# Patient Record
Sex: Female | Born: 1995
Health system: Southern US, Community
[De-identification: ages and names within clinical notes are randomized; demographics above are authoritative.]

## PROBLEM LIST (undated history)

## (undated) DIAGNOSIS — R519 Headache, unspecified: Secondary | ICD-10-CM

## (undated) DIAGNOSIS — I10 Essential (primary) hypertension: Secondary | ICD-10-CM

## (undated) HISTORY — PX: NO PAST SURGERIES: SHX2092

## (undated) HISTORY — DX: Essential (primary) hypertension: I10

---

## 2015-10-07 ENCOUNTER — Emergency Department (HOSPITAL_COMMUNITY): Payer: No Typology Code available for payment source

## 2015-10-07 ENCOUNTER — Emergency Department (HOSPITAL_COMMUNITY)
Admission: EM | Admit: 2015-10-07 | Discharge: 2015-10-07 | Disposition: A | Discharge status: Home / Self Care | Payer: No Typology Code available for payment source | Attending: Emergency Medicine | Admitting: Emergency Medicine

## 2015-10-07 ENCOUNTER — Encounter (HOSPITAL_COMMUNITY): Payer: Self-pay | Admitting: Emergency Medicine

## 2015-10-07 DIAGNOSIS — Y998 Other external cause status: Secondary | ICD-10-CM | POA: Insufficient documentation

## 2015-10-07 DIAGNOSIS — Y9389 Activity, other specified: Secondary | ICD-10-CM | POA: Insufficient documentation

## 2015-10-07 DIAGNOSIS — Y92410 Unspecified street and highway as the place of occurrence of the external cause: Secondary | ICD-10-CM | POA: Diagnosis not present

## 2015-10-07 DIAGNOSIS — Z3202 Encounter for pregnancy test, result negative: Secondary | ICD-10-CM | POA: Diagnosis not present

## 2015-10-07 DIAGNOSIS — S3992XA Unspecified injury of lower back, initial encounter: Secondary | ICD-10-CM | POA: Insufficient documentation

## 2015-10-07 DIAGNOSIS — M545 Low back pain, unspecified: Secondary | ICD-10-CM

## 2015-10-07 LAB — POC URINE PREG, ED: PREG TEST UR: NEGATIVE

## 2015-10-07 MED ORDER — IBUPROFEN 800 MG PO TABS
800.0000 mg | ORAL_TABLET | Freq: Once | ORAL | Status: AC
  Administered 2015-10-07: 800 mg via ORAL
  Filled 2015-10-07: qty 1

## 2015-10-07 NOTE — Discharge Instructions (Signed)
Continue taking ibuprofen as prescribed over-the-counter for pain relief. You may also continue using a heating pad for pain relief. Follow-up with a primary care provider listed in the resource guide provided below. Return to the emergency department if symptoms worsen or new onset of fever, numbness, tingling, loss of bowel or bladder, weakness.    Emergency Department Resource Guide 1) Find a Doctor and Pay Out of Pocket Although you won't have to find out who is covered by your insurance plan, it is a good idea to ask around and get recommendations. You will then need to call the office and see if the doctor you have chosen will accept you as a new patient and what types of options they offer for patients who are self-pay. Some doctors offer discounts or will set up payment plans for their patients who do not have insurance, but you will need to ask so you aren't surprised when you get to your appointment.  2) Contact Your Local Health Department Not all health departments have doctors that can see patients for sick visits, but many do, so it is worth a call to see if yours does. If you don't know where your local health department is, you can check in your phone book. The CDC also has a tool to help you locate your state's health department, and many state websites also have listings of all of their local health departments.  3) Find a Walk-in Clinic If your illness is not likely to be very severe or complicated, you may want to try a walk in clinic. These are popping up all over the country in pharmacies, drugstores, and shopping centers. They're usually staffed by nurse practitioners or physician assistants that have been trained to treat common illnesses and complaints. They're usually fairly quick and inexpensive. However, if you have serious medical issues or chronic medical problems, these are probably not your best option.  No Primary Care Doctor: - Call Health Connect at  [phone] - they  can help you locate a primary care doctor that  accepts your insurance, provides certain services, etc. - Physician Referral Service- [phone]  Chronic Pain Problems: Organization         Address  Phone   Notes  [facility]  [phone] Patients need to be referred by their primary care doctor.   Medication Assistance: Organization         Address  Phone   Notes  [facility] [address] [place], [place] [address] [phone] --Must be a resident of [facility] [place] -- Must have NO insurance coverage whatsoever (no Medicaid/ Medicare, etc.) -- The pt. MUST have a primary care doctor that directs their care regularly and follows them in the community   MedAssist  [phone]   [employer]  [phone]    Agencies that provide inexpensive medical care: Organization         Address  Phone   Notes  [facility]  [phone]   [facility]    [phone]   [facility] [address] [place], [place] [address] [phone]   [facility] [address], [place] [phone]   [facility]anned Parenthood    [phone]) [phone]   [facility]    [phone]   [facility]  [address], [place] Phone:  [phone], Fax:  [phone]  Hours of Operation:  9 am - 6 pm, M-F.  Also accepts Medicaid/Medicare and self-pay.  [facility] for [place], [address], [place] Phone: [phone], Fax: [phone]. Hours of Operation:  8:30 am - 5:30 pm, M-F.  Also accepts Medicaid and self-pay.  [facility] [address] [place] Phone: [phone]   [place], [place], [place] [phone], Ext. [phone] Mondays & Thursdays: 7-9 AM.  First 15 patients are seen on a first come, first serve basis.    [place] Providers:  Organization         Address  Phone   Notes  [facility] [address], [place] [phone] Also accepts self-pay patients.  [facility] [id number] [place], [place]  [phone]   [place], [address], [place] [phone]   [facility] [address] [place] [phone]   Lucianne Lei [address], [place]   [phone] Only accepts [place] Access [place] patients after they have their name applied to their card.   Self-Pay (no insurance) in [facility]:  Organization         Address  Phone   Notes  Sickle Cell Patients, [facility] Internal Medicine [place] [phone]   [facility] [place] [phone]   [facility] [place]  [place], [place], [place] [phone]   [facility]/Dr. Osei-Bonsu  [address] [place] or [address], [place] [phone] Phone number for both [place] and [employer] locations is the same.  Urgent Medical and [facility] [address], [place] [phone]   [facility] [address], [place] or [address] [phone]   [facility] [address], [place] [phone], phone; [phone], fax Sees patients 1st and 3rd Saturday of every month.  Must not qualify for public or private insurance (i.e. Medicaid, Medicare, [place] Health Choice, Veterans' Benefits)  Household income should be no more than 200% of the poverty level The clinic cannot treat you if you are pregnant or think you are pregnant  Sexually transmitted diseases are not treated at the clinic.    Dental Care: Organization         Address  Phone  Notes  [facility] of [facility] [place] [phone] Accepts children up to age 43 who are enrolled in [place] or [place]; pregnant women with a Medicaid card; and children who have applied for Medicaid or [place] Health Choice, but were declined, whose parents can pay a reduced fee at time of service.  [facility] of [facility]  [address], [place] [phone] Accepts children up to age 45 who are enrolled in [place] or [place]; pregnant women with a Medicaid card; and children who have applied for Medicaid or [place] Health Choice, but were declined, whose parents can pay a reduced fee at time of service.  [place] Adult Dental Access PROGRAM  [place] [phone] Patients are seen by appointment only. Walk-ins are not accepted. [place] will  see patients 42 years of age and older. Monday - Tuesday (8am-5pm) Most Wednesdays (8:30-5pm) $30 per visit, cash only  [facility] Adult Dental Access PROGRAM  [address], [facility] [phone] Patients are seen by appointment only. Walk-ins are not accepted. [place] will see patients 35 years of age and older. One Wednesday Evening (Monthly: Volunteer Based).  $30 per visit, cash only  [place]  [phone] for adults; Children under age 10, call Graduate Pediatric Dentistry at [phone]. Children aged 32-14, please call [phone] to request a pediatric application.  Dental services are provided in all areas of dental care including fillings, crowns and bridges, complete and partial dentures, implants, gum treatment, root canals, and extractions. Preventive care is also provided. Treatment is provided to both adults and children. Patients are selected via a lottery and there is often a waiting list.   [facility] [address], [place]  [phone] [email or web address]   [facility]  [facility] [address] [place], [place] [phone], Ext. [phone] Second and Fourth Thursday of each month, opens at 6:30 AM; Clinic ends at 9 AM.  Patients are seen on a first-come first-served basis, and a limited number are seen during each clinic.   [facility]  [address]ard Danker [place], [place] [phone]   Eligibility Requirements You must have lived in [place], [place], or [place] counties for at least the last three months.   You cannot be eligible for state or federal sponsored [employer], including [employer], [place], or [employer].   You generally cannot be eligible for healthcare insurance through your employer.    How to apply: Eligibility screenings are held every Tuesday and Wednesday afternoon from 1:00 pm until 4:00 pm. You do not need an appointment for the interview!  [facility] [address], [place], [place]   [place]  [place] Department  [place]  [phone]    Behavioral Health Resources in the Community: Intensive Outpatient Programs Organization         Address  Phone  Notes  [place] [facility]. [address], [place], [place] [phone]   [address] [facility] Outpatient [address], [place], [place]   ADS: [facility] [address] [place], [place]   [place] [address],  [place], [place] or [phone]   Substance Abuse Resources Organization         Address  Phone  Notes  Alcohol and Drug Services  [phone]   [place]  [phone]   [facility]   Chinita Pester  [phone]   Residential & Outpatient Substance Abuse Program  [phone]   Psychological Services Organization         Address  Phone  Notes  [facility]   [place]  [phone]   [place] [address], [place] or [phone]    Mobile Crisis Teams Organization         Address  Phone  Notes  [facility], Mobile Crisis Care Unit  [phone]   [facility]otherapeutic Services  [address] [place], [place]   [facility] [address] [place] [phone]    Self-Help/Support Groups Organization  Address  Phone             Notes  [facility] [place] - variety of support groups  [phone]- I7437963 Call for more information  Narcotics Anonymous (NA), [facility] [address], [employer] [place]  2 meetings at this location   Statistician         Address  Phone  Notes  [facility] [address],    [place]  [phone]   [facility]  [address], [place] [address], [place], [place] [phone]   [facility] [address] [place], [place] [phone] Admissions: 8am-3pm M-F  [facility] [address]    [place], [place] [phone]   [facility] [address] [place], [place], [place] [phone]   [facility] [address]  [place], [place] [phone]   [facility] - Intensive Outpatient [address], [place], [place] [phone]   [facility] (Addiction Recovery Care [facility]oc[facility] [address] [place].,  [place], [place] [phone] or [phone]   [facility] (RTS) [address], [place], [place] [phone] Accepts Medicaid  [facility] [address],  [place] [phone] Substance Abuse/Addiction Treatment   [facility] Organization         Address  Phone  Notes  [facility]  [phone]   Angie Fava, PhD [address] Ervin Knack [place], [place]   [phone] or  [phone]   [facility]   [address] [place], [place] [phone]   [facility] [address], [place], [place] [phone] Insurance/Medicaid/sponsorship through [facility] and Families [address]                                    [place], [place] [phone] Therapy/tele-psych/case  [facility] [address], [place] [phone]    Dr. Lolly Mustache  [phone]   [facility]  United Way [facility] Dept. 1) [address], [place] 2) [address] [place] 3)  [address] [place] [address], [place] [phone]  [phone]   [facility] Child Abuse Hotline 872 552 7053 or [phone] (After Hours)
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
## 2019-09-08 DIAGNOSIS — S5001XA Contusion of right elbow, initial encounter: Secondary | ICD-10-CM | POA: Diagnosis not present

## 2019-09-17 DIAGNOSIS — Z1321 Encounter for screening for nutritional disorder: Secondary | ICD-10-CM | POA: Diagnosis not present

## 2019-09-17 DIAGNOSIS — M545 Low back pain: Secondary | ICD-10-CM | POA: Diagnosis not present

## 2019-09-17 DIAGNOSIS — E8881 Metabolic syndrome: Secondary | ICD-10-CM | POA: Diagnosis not present

## 2019-09-17 DIAGNOSIS — R6889 Other general symptoms and signs: Secondary | ICD-10-CM | POA: Diagnosis not present

## 2019-09-17 DIAGNOSIS — R5382 Chronic fatigue, unspecified: Secondary | ICD-10-CM | POA: Diagnosis not present

## 2019-10-15 DIAGNOSIS — R632 Polyphagia: Secondary | ICD-10-CM | POA: Diagnosis not present

## 2019-10-15 DIAGNOSIS — Z7189 Other specified counseling: Secondary | ICD-10-CM | POA: Diagnosis not present

## 2019-10-15 DIAGNOSIS — Z713 Dietary counseling and surveillance: Secondary | ICD-10-CM | POA: Diagnosis not present

## 2019-12-05 DIAGNOSIS — Z3009 Encounter for other general counseling and advice on contraception: Secondary | ICD-10-CM | POA: Diagnosis not present

## 2019-12-05 DIAGNOSIS — Z3200 Encounter for pregnancy test, result unknown: Secondary | ICD-10-CM | POA: Diagnosis not present

## 2019-12-16 DIAGNOSIS — Z03818 Encounter for observation for suspected exposure to other biological agents ruled out: Secondary | ICD-10-CM | POA: Diagnosis not present

## 2019-12-27 DIAGNOSIS — Z3A00 Weeks of gestation of pregnancy not specified: Secondary | ICD-10-CM | POA: Diagnosis not present

## 2019-12-27 DIAGNOSIS — Z3490 Encounter for supervision of normal pregnancy, unspecified, unspecified trimester: Secondary | ICD-10-CM | POA: Diagnosis not present

## 2019-12-27 DIAGNOSIS — O210 Mild hyperemesis gravidarum: Secondary | ICD-10-CM | POA: Diagnosis not present

## 2019-12-27 DIAGNOSIS — R112 Nausea with vomiting, unspecified: Secondary | ICD-10-CM | POA: Diagnosis not present

## 2019-12-29 ENCOUNTER — Other Ambulatory Visit: Payer: Self-pay

## 2019-12-29 ENCOUNTER — Encounter (HOSPITAL_COMMUNITY): Payer: Self-pay | Admitting: Obstetrics and Gynecology

## 2019-12-29 ENCOUNTER — Inpatient Hospital Stay (HOSPITAL_COMMUNITY): Payer: BC Managed Care – PPO

## 2019-12-29 ENCOUNTER — Inpatient Hospital Stay (HOSPITAL_COMMUNITY)
Admission: AD | Admit: 2019-12-29 | Discharge: 2019-12-29 | Disposition: A | Discharge status: Home / Self Care | Payer: BC Managed Care – PPO | Attending: Obstetrics and Gynecology | Admitting: Obstetrics and Gynecology

## 2019-12-29 DIAGNOSIS — O210 Mild hyperemesis gravidarum: Secondary | ICD-10-CM | POA: Diagnosis not present

## 2019-12-29 DIAGNOSIS — O4691 Antepartum hemorrhage, unspecified, first trimester: Secondary | ICD-10-CM

## 2019-12-29 DIAGNOSIS — Z3491 Encounter for supervision of normal pregnancy, unspecified, first trimester: Secondary | ICD-10-CM

## 2019-12-29 DIAGNOSIS — Z3A12 12 weeks gestation of pregnancy: Secondary | ICD-10-CM | POA: Insufficient documentation

## 2019-12-29 DIAGNOSIS — Z3A08 8 weeks gestation of pregnancy: Secondary | ICD-10-CM

## 2019-12-29 DIAGNOSIS — Z6790 Unspecified blood type, Rh positive: Secondary | ICD-10-CM | POA: Insufficient documentation

## 2019-12-29 DIAGNOSIS — O4690 Antepartum hemorrhage, unspecified, unspecified trimester: Secondary | ICD-10-CM

## 2019-12-29 DIAGNOSIS — O208 Other hemorrhage in early pregnancy: Secondary | ICD-10-CM | POA: Diagnosis not present

## 2019-12-29 DIAGNOSIS — O468X1 Other antepartum hemorrhage, first trimester: Secondary | ICD-10-CM

## 2019-12-29 DIAGNOSIS — O418X10 Other specified disorders of amniotic fluid and membranes, first trimester, not applicable or unspecified: Secondary | ICD-10-CM | POA: Diagnosis not present

## 2019-12-29 HISTORY — DX: Headache, unspecified: R51.9

## 2019-12-29 LAB — COMPREHENSIVE METABOLIC PANEL
ALT: 15 U/L (ref 0–44)
AST: 18 U/L (ref 15–41)
Albumin: 3.8 g/dL (ref 3.5–5.0)
Alkaline Phosphatase: 53 U/L (ref 38–126)
Anion gap: 10 (ref 5–15)
BUN: 7 mg/dL (ref 6–20)
CO2: 23 mmol/L (ref 22–32)
Calcium: 9.3 mg/dL (ref 8.9–10.3)
Chloride: 106 mmol/L (ref 98–111)
Creatinine, Ser: 0.64 mg/dL (ref 0.44–1.00)
GFR calc Af Amer: >60 mL/min (ref >60)
GFR calc non Af Amer: >60 mL/min (ref >60)
Glucose, Bld: 109 mg/dL — ABNORMAL HIGH (ref 70–99)
Potassium: 3.4 mmol/L — ABNORMAL LOW (ref 3.5–5.1)
Sodium: 139 mmol/L (ref 135–145)
Total Bilirubin: 0.3 mg/dL (ref 0.3–1.2)
Total Protein: 7.2 g/dL (ref 6.5–8.1)

## 2019-12-29 LAB — WET PREP, GENITAL
Sperm: NONE SEEN
Trich, Wet Prep: NONE SEEN
Yeast Wet Prep HPF POC: NONE SEEN

## 2019-12-29 LAB — URINALYSIS, ROUTINE W REFLEX MICROSCOPIC
Bilirubin Urine: NEGATIVE
Glucose, UA: NEGATIVE mg/dL
Ketones, ur: NEGATIVE mg/dL
Leukocytes,Ua: NEGATIVE
Nitrite: NEGATIVE
Protein, ur: 30 mg/dL — AB
Specific Gravity, Urine: 1.029 (ref 1.005–1.030)
pH: 5 (ref 5.0–8.0)

## 2019-12-29 LAB — CBC
HCT: 37.6 % (ref 36.0–46.0)
Hemoglobin: 12.7 g/dL (ref 12.0–15.0)
MCH: 29 pg (ref 26.0–34.0)
MCHC: 33.8 g/dL (ref 30.0–36.0)
MCV: 85.8 fL (ref 80.0–100.0)
Platelets: 362 10*3/uL (ref 150–400)
RBC: 4.38 MIL/uL (ref 3.87–5.11)
RDW: 12.7 % (ref 11.5–15.5)
WBC: 13.6 10*3/uL — ABNORMAL HIGH (ref 4.0–10.5)
nRBC: 0 % (ref 0.0–0.2)

## 2019-12-29 LAB — HCG, QUANTITATIVE, PREGNANCY: hCG, Beta Chain, Quant, S: 141320 m[IU]/mL — ABNORMAL HIGH (ref <5)

## 2019-12-29 LAB — ABO/RH: ABO/RH(D): O POS

## 2019-12-29 NOTE — Discharge Instructions (Signed)
Morning Sickness  Morning sickness is when you feel sick to your stomach (nauseous) during pregnancy. You may feel sick to your stomach and throw up (vomit). You may feel sick in the morning, but you can feel this way at any time of day. Some women feel very sick to their stomach and cannot stop throwing up (hyperemesis gravidarum). Follow these instructions at home: Medicines  Take over-the-counter and prescription medicines only as told by your doctor. Do not take any medicines until you talk with your doctor about them first.  Taking multivitamins before getting pregnant can stop or lessen the harshness of morning sickness. Eating and drinking  Eat dry toast or crackers before getting out of bed.  Eat 5 or 6 small meals a day.  Eat dry and bland foods like rice and baked potatoes.  Do not eat greasy, fatty, or spicy foods.  Have someone cook for you if the smell of food causes you to feel sick or throw up.  If you feel sick to your stomach after taking prenatal vitamins, take them at night or with a snack.  Eat protein when you need a snack. Nuts, yogurt, and cheese are good choices.  Drink fluids throughout the day.  Try ginger ale made with real ginger, ginger tea made from fresh grated ginger, or ginger candies. General instructions  Do not use any products that have nicotine or tobacco in them, such as cigarettes and e-cigarettes. If you need help quitting, ask your doctor.  Use an air purifier to keep the air in your house free of smells.  Get lots of fresh air.  Try to avoid smells that make you feel sick.  Try: ? Wearing a bracelet that is used for seasickness (acupressure wristband). ? Going to a doctor who puts thin needles into certain body points (acupuncture) to improve how you feel. Contact a doctor if:  You need medicine to feel better.  You feel dizzy or light-headed.  You are losing weight. Get help right away if:  You feel very sick to your  stomach and cannot stop throwing up.  You pass out (faint).  You have very bad pain in your belly. Summary  Morning sickness is when you feel sick to your stomach (nauseous) during pregnancy.  You may feel sick in the morning, but you can feel this way at any time of day.  Making some changes to what you eat may help your symptoms go away. This information is not intended to replace advice given to you by your health care provider. Make sure you discuss any questions you have with your health care provider. Document Revised: 11/16/2017 Document Reviewed: 01/04/2017 Elsevier Patient Education  2020 Elsevier Inc. Vaginal Bleeding During Pregnancy, First Trimester  A small amount of bleeding (spotting) from the vagina is common during early pregnancy. Sometimes the bleeding is normal and does not cause problems. At other times, though, bleeding may be a sign of something serious. Tell your doctor about any bleeding from your vagina right away. Follow these instructions at home: Activity  Follow your doctor's instructions about how active you can be.  If needed, make plans for someone to help with your normal activities.  Do not have sex or orgasms until your doctor says that this is safe. General instructions  Take over-the-counter and prescription medicines only as told by your doctor.  Watch your condition for any changes.  Write down: ? The number of pads you use each day. ? How often you  change pads. ? How soaked (saturated) your pads are.  Do not use tampons.  Do not douche.  If you pass any tissue from your vagina, save it to show to your doctor.  Keep all follow-up visits as told by your doctor. This is important. Contact a doctor if:  You have vaginal bleeding at any time while you are pregnant.  You have cramps.  You have a fever. Get help right away if:  You have very bad cramps in your back or belly (abdomen).  You pass large clots or a lot of tissue  from your vagina.  Your bleeding gets worse.  You feel light-headed.  You feel weak.  You pass out (faint).  You have chills.  You are leaking fluid from your vagina.  You have a gush of fluid from your vagina. Summary  Sometimes vaginal bleeding during pregnancy is normal and does not cause problems. At other times, bleeding may be a sign of something serious.  Tell your doctor about any bleeding from your vagina right away.  Follow your doctor's instructions about how active you can be. You may need someone to help you with your normal activities. This information is not intended to replace advice given to you by your health care provider. Make sure you discuss any questions you have with your health care provider. Document Revised: 03/25/2019 Document Reviewed: 03/07/2017 Elsevier Patient Education  2020 Elsevier Inc.   Subchorionic Hematoma  A subchorionic hematoma is a gathering of blood between the outer wall of the embryo (chorion) and the inner wall of the womb (uterus). This condition can cause vaginal bleeding. If they cause little or no vaginal bleeding, early small hematomas usually shrink on their own and do not affect your baby or pregnancy. When bleeding starts later in pregnancy, or if the hematoma is larger or occurs in older pregnant women, the condition may be more serious. Larger hematomas may get bigger, which increases the chances of miscarriage. This condition also increases the risk of:  Premature separation of the placenta from the uterus.  Premature (preterm) labor.  Stillbirth. What are the causes? The exact cause of this condition is not known. It occurs when blood is trapped between the placenta and the uterine wall because the placenta has separated from the original site of implantation. What increases the risk? You are more likely to develop this condition if:  You were treated with fertility medicines.  You conceived through in vitro  fertilization (IVF). What are the signs or symptoms? Symptoms of this condition include:  Vaginal spotting or bleeding.  Contractions of the uterus. These cause abdominal pain. Sometimes you may have no symptoms and the bleeding may only be seen when ultrasound images are taken (transvaginal ultrasound). How is this diagnosed? This condition is diagnosed based on a physical exam. This includes a pelvic exam. You may also have other tests, including:  Blood tests.  Urine tests.  Ultrasound of the abdomen. How is this treated? Treatment for this condition can vary. Treatment may include:  Watchful waiting. You will be monitored closely for any changes in bleeding. During this stage: ? The hematoma may be reabsorbed by the body. ? The hematoma may separate the fluid-filled space containing the embryo (gestational sac) from the wall of the womb (endometrium).  Medicines.  Activity restriction. This may be needed until the bleeding stops. Follow these instructions at home:  Stay on bed rest if told to do so by your health care provider.  Do  not lift anything that is heavier than 10 lbs. (4.5 kg) or as told by your health care provider.  Do not use any products that contain nicotine or tobacco, such as cigarettes and e-cigarettes. If you need help quitting, ask your health care provider.  Track and write down the number of pads you use each day and how soaked (saturated) they are.  Do not use tampons.  Keep all follow-up visits as told by your health care provider. This is important. Your health care provider may ask you to have follow-up blood tests or ultrasound tests or both. Contact a health care provider if:  You have any vaginal bleeding.  You have a fever. Get help right away if:  You have severe cramps in your stomach, back, abdomen, or pelvis.  You pass large clots or tissue. Save any tissue for your health care provider to look at.  You have more vaginal  bleeding, and you faint or become lightheaded or weak. Summary  A subchorionic hematoma is a gathering of blood between the outer wall of the placenta and the uterus.  This condition can cause vaginal bleeding.  Sometimes you may have no symptoms and the bleeding may only be seen when ultrasound images are taken.  Treatment may include watchful waiting, medicines, or activity restriction. This information is not intended to replace advice given to you by your health care provider. Make sure you discuss any questions you have with your health care provider. Document Revised: 11/16/2017 Document Reviewed: 01/30/2017 Elsevier Patient Education  2020 [employer].                   Safe Medications in Pregnancy    Acne: Benzoyl Peroxide Salicylic Acid  Backache/Headache: Tylenol: 2 regular strength every 4 hours OR              2 Extra strength every 6 hours  Colds/Coughs/Allergies: Benadryl (alcohol free) 25 mg every 6 hours as needed Breath right strips Claritin Cepacol throat lozenges Chloraseptic throat spray Cold-Eeze- up to three times per day Cough drops, alcohol free Flonase (by prescription only) Guaifenesin Mucinex Robitussin DM (plain only, alcohol free) Saline nasal spray/drops Sudafed (pseudoephedrine) & Actifed ** use only after [redacted] weeks gestation and if you do not have high blood pressure Tylenol Vicks Vaporub Zinc lozenges Zyrtec   Constipation: Colace Ducolax suppositories Fleet enema Glycerin suppositories Metamucil Milk of magnesia Miralax Senokot Smooth move tea  Diarrhea: Kaopectate Imodium A-D  *NO pepto Bismol  Hemorrhoids: Anusol Anusol HC Preparation H Tucks  Indigestion: Tums Maalox Mylanta Zantac  Pepcid  Insomnia: Benadryl (alcohol free) 25mg  every 6 hours as needed Tylenol PM Unisom, no Gelcaps  Leg Cramps: Tums MagGel  Nausea/Vomiting:  Bonine Dramamine Emetrol Ginger extract Sea bands Meclizine    Nausea medication to take during pregnancy:  Unisom (doxylamine succinate 25 mg tablets) Take one tablet daily at bedtime. If symptoms are not adequately controlled, the dose can be increased to a maximum recommended dose of two tablets daily (1/2 tablet in the morning, 1/2 tablet mid-afternoon and one at bedtime). Vitamin B6 100mg  tablets. Take one tablet twice a day (up to 200 mg per day).  Skin Rashes: Aveeno products Benadryl cream or 25mg  every 6 hours as needed Calamine Lotion 1% cortisone cream  Yeast infection: Gyne-lotrimin 7 Monistat 7   **If taking multiple medications, please check labels to avoid duplicating the same active ingredients **take medication as directed on the label ** Do not exceed 4000 mg  of tylenol in 24 hours **Do not take medications that contain aspirin or ibuprofen   [place] for [employer] at [facility]       Phone: [phone]  [facility] for [employer] at [place]   Phone: [place] for [employer] at [place]  Phone: [place] for [place] at [facility]  Phone: [place] for [place] at [place]  Phone: [place] for [place] at [facility]   Phone: [facility]       Phone: [phone]  [facility]    Phone: [phone]   [facility]    Phone: [phone]  [facility]    Phone: [phone]  [place]    Phone: [phone]  [place] [facility]       Phone: [phone]   [place] [facility]  Phone: [place]    Phone: [phone]  [facility]   Phone: [phone]  [facility]      Phone: [phone]  [facility]    Phone:  [phone]

## 2019-12-29 NOTE — MAU Provider Note (Signed)
History     CSN: [id number]  Arrival date and time: 12/29/19 5638   First Provider Initiated Contact with Patient 12/29/19 1049      Chief Complaint  Patient presents with  . Emesis  . Vaginal Bleeding   23 y.o. G1 @[redacted]w[redacted]d  by LMP presenting with VB. Noticed bright red blood in the toilet around 8am today. Bleeding has not continued. Denies abd pain. Reports trouble with morning sickness. Has 3-4 episodes of emesis each morning. Was seen at urgent care a few days ago but has not started Unisom and B6 that was prescribed. Denies vaginal discharge. No concern for STDs.   OB History    Gravida  1   Para      Term      Preterm      AB      Living        SAB      TAB      Ectopic      Multiple      Live Births              Past Medical History:  Diagnosis Date  . Headache     History reviewed. No pertinent surgical history.  Family History  Problem Relation Age of Onset  . Hypertension Mother   . Crohn's disease Mother     Social History   Tobacco Use  . Smoking status: Never Smoker  . Smokeless tobacco: Never Used  Substance Use Topics  . Alcohol use: Not Currently  . Drug use: Not Currently    Types: Marijuana    Allergies: No Known Allergies  No medications prior to admission.    Review of Systems  Gastrointestinal: Positive for nausea and vomiting. Negative for abdominal pain.  Genitourinary: Positive for vaginal bleeding. Negative for vaginal discharge.   Physical Exam   Blood pressure (!) 144/85, pulse 96, temperature 98.4 F (36.9 C), temperature source Oral, resp. rate 18, height 5\' 1"  (1.549 m), weight 95.1 kg, last menstrual period 10/01/2019, SpO2 98 %. Patient Vitals for the past 24 hrs:  BP Temp Temp src Pulse Resp SpO2 Height Weight  12/29/19 1225 (!) 144/85 - - 96 - 98 % - -  12/29/19 1223 (!) 144/85 - - (!) 102 18 100 % - -  12/29/19 1043 133/72 - - 96 - - - -  12/29/19 1015 (!) 118/98 98.4 F (36.9 C) Oral 100 18 100 %  5\' 1"  (1.549 m) 95.1 kg    Physical Exam  Nursing note and vitals reviewed. Constitutional: She is oriented to person, place, and time. She appears well-developed and well-nourished. No distress.  HENT:  Head: Normocephalic and atraumatic.  Cardiovascular: Normal rate.  Respiratory: Effort normal. No respiratory distress.  GI: Soft. She exhibits no distension and no mass. There is no abdominal tenderness. There is no rebound and no guarding.  Genitourinary:    Genitourinary Comments: External: no lesions or erythema Vagina: rugated, pink, moist, scant dark red discharge, yellow thick discharge at os Uterus/adnexae: unable to appreciate d/t body habitus, no CMT Cervix closed/long    Musculoskeletal:        General: Normal range of motion.     Cervical back: Normal range of motion.  Neurological: She is alert and oriented to person, place, and time.   Results for orders placed or performed during the hospital encounter of 12/29/19 (from the past 24 hour(s))  Wet prep, genital     Status: Abnormal   Collection Time:  12/29/19 10:35 AM   Specimen: Cervix  Result Value Ref Range   Yeast Wet Prep HPF POC NONE SEEN NONE SEEN   Trich, Wet Prep NONE SEEN NONE SEEN   Clue Cells Wet Prep HPF POC PRESENT (A) NONE SEEN   WBC, Wet Prep HPF POC MANY (A) NONE SEEN   Sperm NONE SEEN   ABO/Rh     Status: None   Collection Time: 12/29/19 10:43 AM  Result Value Ref Range   ABO/RH(D) O POS    No rh immune globuloin      NOT A RH IMMUNE GLOBULIN CANDIDATE, PT RH POSITIVE Performed at [facility], [address] [place], [place] [address]   CBC     Status: Abnormal   Collection Time: 12/29/19 10:43 AM  Result Value Ref Range   WBC 13.6 (H) 4.0 - 10.5 K/uL   RBC 4.38 3.87 - 5.11 MIL/uL   Hemoglobin 12.7 12.0 - 15.0 g/dL   HCT 16.5 53.7 - 48.2 %   MCV 85.8 80.0 - 100.0 fL   MCH 29.0 26.0 - 34.0 pg   MCHC 33.8 30.0 - 36.0 g/dL   RDW 70.7 86.7 - 54.4 %   Platelets 362 150 - 400 K/uL    nRBC 0.0 0.0 - 0.2 %  Comprehensive metabolic panel     Status: Abnormal   Collection Time: 12/29/19 10:43 AM  Result Value Ref Range   Sodium 139 135 - 145 mmol/L   Potassium 3.4 (L) 3.5 - 5.1 mmol/L   Chloride 106 98 - 111 mmol/L   CO2 23 22 - 32 mmol/L   Glucose, Bld 109 (H) 70 - 99 mg/dL   BUN 7 6 - 20 mg/dL   Creatinine, Ser 9.20 0.44 - 1.00 mg/dL   Calcium 9.3 8.9 - 10.0 mg/dL   Total Protein 7.2 6.5 - 8.1 g/dL   Albumin 3.8 3.5 - 5.0 g/dL   AST 18 15 - 41 U/L   ALT 15 0 - 44 U/L   Alkaline Phosphatase 53 38 - 126 U/L   Total Bilirubin 0.3 0.3 - 1.2 mg/dL   GFR calc non Af Amer >60 >60 mL/min   GFR calc Af Amer >60 >60 mL/min   Anion gap 10 5 - 15  hCG, quantitative, pregnancy     Status: Abnormal   Collection Time: 12/29/19 10:43 AM  Result Value Ref Range   hCG, Beta Chain, Quant, S 141,320 (H) <5 mIU/mL  Urinalysis, Routine w reflex microscopic     Status: Abnormal   Collection Time: 12/29/19 11:05 AM  Result Value Ref Range   Color, Urine AMBER (A) YELLOW   APPearance TURBID (A) CLEAR   Specific Gravity, Urine 1.029 1.005 - 1.030   pH 5.0 5.0 - 8.0   Glucose, UA NEGATIVE NEGATIVE mg/dL   Hgb urine dipstick MODERATE (A) NEGATIVE   Bilirubin Urine NEGATIVE NEGATIVE   Ketones, ur NEGATIVE NEGATIVE mg/dL   Protein, ur 30 (A) NEGATIVE mg/dL   Nitrite NEGATIVE NEGATIVE   Leukocytes,Ua NEGATIVE NEGATIVE   RBC / HPF 21-50 0 - 5 RBC/hpf   WBC, UA 0-5 0 - 5 WBC/hpf   Bacteria, UA RARE (A) NONE SEEN   Squamous Epithelial / LPF 0-5 0 - 5   Mucus PRESENT     US OB LESS THAN 14 WEEKS WITH OB TRANSVAGINAL  Result Date: 12/29/2019 CLINICAL DATA:  Vaginal bleeding in pregnancy. EXAM: OBSTETRIC <14 WK [place] AND TRANSVAGINAL OB US TECHNIQUE: Both transabdominal  and transvaginal ultrasound examinations were performed for complete evaluation of the gestation as well as the maternal uterus, adnexal regions, and pelvic cul-de-sac. Transvaginal technique was performed to assess early  pregnancy. COMPARISON:  None. FINDINGS: Intrauterine gestational sac: Single Yolk sac:  Visualized. Embryo:  Visualized. Cardiac Activity: Visualized. Heart Rate: 179 bpm CRL:  19.25 mm   8 w   3 d                  [place] EDC: August 06, 2020. Subchorionic hemorrhage:  Small subchorionic hemorrhage is noted. Maternal uterus/adnexae: Probable corpus luteum cyst seen in right ovary. Left ovary appears normal. No free fluid is noted. IMPRESSION: Single live intrauterine gestation of 8 weeks 3 days. Small subchorionic hemorrhage is noted. Electronically Signed   By: Marijo Conception M.D.   On: 12/29/2019 12:08   MAU Course  Procedures  MDM Labs and [place] ordered and reviewed. Normal IUP on [place] with [facility] which is likely cause of bleeding. Discussed results with pt. ?chronic HTN, but never been diagnosed. BP elevated on 03/06/15 and today. Stable for discharge home.   Assessment and Plan   1. [redacted] weeks gestation of pregnancy   2. Vaginal bleeding in pregnancy   3. Normal intrauterine pregnancy on prenatal ultrasound in first trimester   4. Blood type, Rh positive   5. Morning sickness   6. Subchorionic hematoma in first trimester, single or unspecified fetus    Discharge home Follow up with OB provider of choice to start care SAB precautions Start Unisom and B6 today  Allergies as of 12/29/2019   No Known Allergies     Medication List    STOP taking these medications   aspirin-acetaminophen-caffeine 250-250-65 MG tablet Commonly known as: EXCEDRIN MIGRAINE     TAKE these medications   prenatal multivitamin Tabs tablet Take 1 tablet by mouth daily at 12 noon.      Julianne Handler, CNM 12/29/2019, 12:59 PM

## 2019-12-29 NOTE — MAU Note (Signed)
Was throwing up really bad this morning, when she went to the bathroom, she noted that she had started bleeding, noted a quarter sized clot. preg confirmed at [facility]. Denies pain.

## 2019-12-30 LAB — GC/CHLAMYDIA PROBE AMP (~~LOC~~) NOT AT ARMC
Chlamydia: NEGATIVE
Comment: NEGATIVE
Comment: NORMAL
Neisseria Gonorrhea: NEGATIVE

## 2020-01-12 DIAGNOSIS — Z3401 Encounter for supervision of normal first pregnancy, first trimester: Secondary | ICD-10-CM | POA: Diagnosis not present

## 2020-01-12 DIAGNOSIS — R87610 Atypical squamous cells of undetermined significance on cytologic smear of cervix (ASC-US): Secondary | ICD-10-CM | POA: Diagnosis not present

## 2020-01-13 DIAGNOSIS — Z20828 Contact with and (suspected) exposure to other viral communicable diseases: Secondary | ICD-10-CM | POA: Diagnosis not present

## 2020-01-13 DIAGNOSIS — Z7189 Other specified counseling: Secondary | ICD-10-CM | POA: Diagnosis not present

## 2020-01-14 ENCOUNTER — Other Ambulatory Visit (HOSPITAL_COMMUNITY): Payer: Self-pay | Admitting: Nurse Practitioner

## 2020-01-14 DIAGNOSIS — Z369 Encounter for antenatal screening, unspecified: Secondary | ICD-10-CM

## 2020-01-14 DIAGNOSIS — Z3A13 13 weeks gestation of pregnancy: Secondary | ICD-10-CM

## 2020-01-26 ENCOUNTER — Ambulatory Visit (HOSPITAL_COMMUNITY): Payer: BC Managed Care – PPO

## 2020-01-26 ENCOUNTER — Ambulatory Visit (HOSPITAL_COMMUNITY): Payer: BC Managed Care – PPO | Attending: Obstetrics and Gynecology

## 2020-01-27 DIAGNOSIS — Z3401 Encounter for supervision of normal first pregnancy, first trimester: Secondary | ICD-10-CM | POA: Diagnosis not present

## 2020-01-30 ENCOUNTER — Other Ambulatory Visit: Payer: Self-pay

## 2020-01-30 ENCOUNTER — Ambulatory Visit (HOSPITAL_COMMUNITY): Payer: BC Managed Care – PPO

## 2020-01-30 ENCOUNTER — Ambulatory Visit (HOSPITAL_COMMUNITY)
Admission: RE | Admit: 2020-01-30 | Discharge: 2020-01-30 | Disposition: A | Discharge status: Home / Self Care | Payer: BC Managed Care – PPO | Source: Ambulatory Visit | Attending: Obstetrics and Gynecology | Admitting: Obstetrics and Gynecology

## 2020-01-30 ENCOUNTER — Encounter (HOSPITAL_COMMUNITY): Payer: Self-pay

## 2020-01-30 DIAGNOSIS — Z3A13 13 weeks gestation of pregnancy: Secondary | ICD-10-CM

## 2020-01-30 DIAGNOSIS — Z369 Encounter for antenatal screening, unspecified: Secondary | ICD-10-CM

## 2020-02-24 DIAGNOSIS — Z3401 Encounter for supervision of normal first pregnancy, first trimester: Secondary | ICD-10-CM | POA: Diagnosis not present

## 2020-02-24 DIAGNOSIS — Z3402 Encounter for supervision of normal first pregnancy, second trimester: Secondary | ICD-10-CM | POA: Diagnosis not present

## 2020-03-09 DIAGNOSIS — Z3402 Encounter for supervision of normal first pregnancy, second trimester: Secondary | ICD-10-CM | POA: Diagnosis not present

## 2020-03-15 DIAGNOSIS — Z3402 Encounter for supervision of normal first pregnancy, second trimester: Secondary | ICD-10-CM | POA: Diagnosis not present

## 2020-03-15 DIAGNOSIS — Z36 Encounter for antenatal screening for chromosomal anomalies: Secondary | ICD-10-CM | POA: Diagnosis not present

## 2020-03-15 DIAGNOSIS — O09212 Supervision of pregnancy with history of pre-term labor, second trimester: Secondary | ICD-10-CM | POA: Diagnosis not present

## 2020-03-15 DIAGNOSIS — Z363 Encounter for antenatal screening for malformations: Secondary | ICD-10-CM | POA: Diagnosis not present

## 2020-03-23 DIAGNOSIS — Z3402 Encounter for supervision of normal first pregnancy, second trimester: Secondary | ICD-10-CM | POA: Diagnosis not present

## 2020-03-25 DIAGNOSIS — Z3402 Encounter for supervision of normal first pregnancy, second trimester: Secondary | ICD-10-CM | POA: Diagnosis not present

## 2020-03-27 DIAGNOSIS — O42912 Preterm premature rupture of membranes, unspecified as to length of time between rupture and onset of labor, second trimester: Secondary | ICD-10-CM | POA: Diagnosis not present

## 2020-03-27 DIAGNOSIS — O321XX0 Maternal care for breech presentation, not applicable or unspecified: Secondary | ICD-10-CM | POA: Diagnosis not present

## 2020-03-27 DIAGNOSIS — Z3A21 21 weeks gestation of pregnancy: Secondary | ICD-10-CM | POA: Diagnosis not present

## 2020-03-27 DIAGNOSIS — O0289 Other abnormal products of conception: Secondary | ICD-10-CM | POA: Diagnosis not present

## 2020-03-28 DIAGNOSIS — O0289 Other abnormal products of conception: Secondary | ICD-10-CM

## 2020-03-30 DIAGNOSIS — O411220 Chorioamnionitis, second trimester, not applicable or unspecified: Secondary | ICD-10-CM | POA: Diagnosis not present

## 2020-03-30 DIAGNOSIS — Z3A21 21 weeks gestation of pregnancy: Secondary | ICD-10-CM | POA: Diagnosis not present

## 2020-04-06 DIAGNOSIS — Z3402 Encounter for supervision of normal first pregnancy, second trimester: Secondary | ICD-10-CM | POA: Diagnosis not present

## 2020-04-23 DIAGNOSIS — Z3149 Encounter for other procreative investigation and testing: Secondary | ICD-10-CM | POA: Diagnosis not present

## 2020-05-06 DIAGNOSIS — Z3482 Encounter for supervision of other normal pregnancy, second trimester: Secondary | ICD-10-CM | POA: Diagnosis not present

## 2020-05-06 DIAGNOSIS — Z3483 Encounter for supervision of other normal pregnancy, third trimester: Secondary | ICD-10-CM | POA: Diagnosis not present

## 2020-07-23 DIAGNOSIS — J988 Other specified respiratory disorders: Secondary | ICD-10-CM | POA: Diagnosis not present

## 2020-07-23 DIAGNOSIS — R519 Headache, unspecified: Secondary | ICD-10-CM | POA: Diagnosis not present

## 2020-10-22 DIAGNOSIS — Z03818 Encounter for observation for suspected exposure to other biological agents ruled out: Secondary | ICD-10-CM | POA: Diagnosis not present

## 2020-10-30 DIAGNOSIS — Z20822 Contact with and (suspected) exposure to covid-19: Secondary | ICD-10-CM | POA: Diagnosis not present

## 2020-12-18 NOTE — L&D Delivery Note (Signed)
OB/GYN Faculty Practice Delivery Note  Olivia Taylor is a 25 y.o. G2P0100 s/p VD at [redacted]w[redacted]d. She was admitted for IOL due to low normal fetal growth.   ROM: 19h 21m with clear fluid GBS Status:  Positive/-- (12/06 1111) Maximum Maternal Temperature: 98.84F  Labor Progress: Initial SVE: 1/60/-1. She then progressed to complete with assistance of cytotec, pit, and AROM.    Delivery Date/Time: 12/28 at 2224  Delivery: Called to room and patient was complete and pushing. Head delivered LOA. No nuchal cord present, however cord wrapped around right foot. Shoulder and body delivered in usual fashion. Infant with good tone, placed on mother's abdomen, dried and stimulated. Cord clamped x 2 after 1-minute delay, and cut by FOB. Cord blood drawn. Placenta delivered spontaneously with gentle cord traction. Fundus firm with massage and Pitocin. Labia, perineum, vagina, and cervix inspected with a 2nd degree perineal and right periurethral laceration. The 2nd degree was repaired in usual fashion with 3.0 vicryl and the peri-urethral was hemostatic after pressure/not requiring repair.   Baby Weight: pending  Placenta: 3 vessel, intact. Sent to L&D Complications: None Lacerations: 2nd degree perineal, periurethral  EBL: 400 mL Analgesia: Epidural, local lidocaine    Infant:  APGAR (1 MIN): 8   APGAR (5 MINS): 9    Leticia Penna, DO  OB Family Medicine Fellow, [facility] for [facility], [facility] 12/14/2021, 11:38 PM

## 2021-04-14 LAB — OB RESULTS CONSOLE ANTIBODY SCREEN: Antibody Screen: NEGATIVE

## 2021-04-14 LAB — OB RESULTS CONSOLE ABO/RH: RH Type: POSITIVE

## 2021-06-13 DIAGNOSIS — O0990 Supervision of high risk pregnancy, unspecified, unspecified trimester: Secondary | ICD-10-CM | POA: Insufficient documentation

## 2021-06-14 ENCOUNTER — Ambulatory Visit (INDEPENDENT_AMBULATORY_CARE_PROVIDER_SITE_OTHER): Payer: Medicaid Other | Admitting: Advanced Practice Midwife

## 2021-06-14 ENCOUNTER — Other Ambulatory Visit (HOSPITAL_COMMUNITY)
Admission: RE | Admit: 2021-06-14 | Discharge: 2021-06-14 | Disposition: A | Discharge status: Home / Self Care | Payer: Medicaid Other | Source: Ambulatory Visit | Attending: Advanced Practice Midwife | Admitting: Advanced Practice Midwife

## 2021-06-14 ENCOUNTER — Other Ambulatory Visit: Payer: Self-pay

## 2021-06-14 ENCOUNTER — Encounter: Payer: Self-pay | Admitting: Advanced Practice Midwife

## 2021-06-14 VITALS — BP 114/80 | HR 112 | Wt 197.0 lb

## 2021-06-14 DIAGNOSIS — Z8759 Personal history of other complications of pregnancy, childbirth and the puerperium: Secondary | ICD-10-CM | POA: Insufficient documentation

## 2021-06-14 DIAGNOSIS — Z3480 Encounter for supervision of other normal pregnancy, unspecified trimester: Secondary | ICD-10-CM | POA: Diagnosis present

## 2021-06-14 DIAGNOSIS — O09299 Supervision of pregnancy with other poor reproductive or obstetric history, unspecified trimester: Secondary | ICD-10-CM | POA: Insufficient documentation

## 2021-06-14 DIAGNOSIS — Z3A13 13 weeks gestation of pregnancy: Secondary | ICD-10-CM

## 2021-06-14 DIAGNOSIS — O210 Mild hyperemesis gravidarum: Secondary | ICD-10-CM | POA: Insufficient documentation

## 2021-06-14 DIAGNOSIS — O0990 Supervision of high risk pregnancy, unspecified, unspecified trimester: Secondary | ICD-10-CM

## 2021-06-14 DIAGNOSIS — O99210 Obesity complicating pregnancy, unspecified trimester: Secondary | ICD-10-CM

## 2021-06-14 DIAGNOSIS — O219 Vomiting of pregnancy, unspecified: Secondary | ICD-10-CM

## 2021-06-14 DIAGNOSIS — R87610 Atypical squamous cells of undetermined significance on cytologic smear of cervix (ASC-US): Secondary | ICD-10-CM

## 2021-06-14 LAB — HEPATITIS C ANTIBODY: HCV Ab: NEGATIVE

## 2021-06-14 MED ORDER — SCOPOLAMINE 1 MG/3DAYS TD PT72: 1.0000 | MEDICATED_PATCH | TRANSDERMAL | 12 refills | Status: DC

## 2021-06-14 MED ORDER — PROMETHAZINE HCL 12.5 MG PO TABS: 12.5000 mg | ORAL_TABLET | Freq: Every evening | ORAL | 0 refills | Status: DC | PRN

## 2021-06-14 MED ORDER — ONDANSETRON 4 MG PO TBDP: 4.0000 mg | ORAL_TABLET | Freq: Three times a day (TID) | ORAL | 3 refills | Status: DC | PRN

## 2021-06-14 MED ORDER — ASPIRIN EC 81 MG PO TBEC: 81.0000 mg | DELAYED_RELEASE_TABLET | Freq: Every day | ORAL | 5 refills | Status: DC

## 2021-06-14 MED ORDER — OB COMPLETE PETITE 35-5-1-200 MG PO CAPS: 1.0000 | ORAL_CAPSULE | Freq: Every day | ORAL | 10 refills | Status: DC

## 2021-06-14 NOTE — Progress Notes (Signed)
Subjective:   Somaly Marteney is a 25 y.o. G2P0100 at [redacted]w[redacted]d  by 5 week [place] being seen today for her first obstetrical visit.  Her obstetrical history is significant for  previable delivery at 21 weeks  and has Supervision of high risk pregnancy, antepartum; ASCUS of cervix with negative high risk HPV; Hyperemesis gravidarum, antepartum; and History of incompetent cervix, currently pregnant on their problem list.. Patient does intend to breast feed. Pregnancy history fully reviewed.  Patient reports  n/v and weight loss during pregnancy .  HISTORY: OB History  Gravida Para Term Preterm AB Living  2 1 0 1 0 0  SAB IAB Ectopic Multiple Live Births  0 0 0 0 0    # Outcome Date GA Lbr Len/2nd Weight Sex Delivery Anes PTL Lv  2 Current           1 Preterm 03/27/20 [redacted]w[redacted]d       FD   Past Medical History:  Diagnosis Date   Headache    Preterm labor    History reviewed. No pertinent surgical history. Family History  Problem Relation Age of Onset   Hypertension Mother    Crohn's disease Mother    Hypertension Maternal Grandmother    Cancer Maternal Grandmother    Social History   Tobacco Use   Smoking status: Never   Smokeless tobacco: Never  Vaping Use   Vaping Use: Never used  Substance Use Topics   Alcohol use: Not Currently   Drug use: Not Currently    Types: Marijuana   No Known Allergies No current outpatient medications on file prior to visit.   No current facility-administered medications on file prior to visit.     Indications for ASA therapy (per uptodate) One of the following: Previous pregnancy with preeclampsia, especially early onset and with an adverse outcome No Multifetal gestation No Chronic hypertension No Type 1 or 2 diabetes mellitus No Chronic kidney disease No Autoimmune disease (antiphospholipid syndrome, systemic lupus erythematosus) No   Two or more of the following: Nulliparity No Obesity (body mass index >30 kg/m2) Yes Family  history of preeclampsia in mother or sister No Age ?35 years Yes Sociodemographic characteristics (African American race, low socioeconomic level) Yes Personal risk factors (eg, previous pregnancy with low birth weight or small for gestational age infant, previous adverse pregnancy outcome [eg, stillbirth], interval >10 years between pregnancies) No   Indications for early 1 hour GTT (per uptodate)  BMI >25 (>23 in Asian women) AND one of the following  Gestational diabetes mellitus in a previous pregnancy No Glycated hemoglobin ?5.7 percent (39 mmol/mol), impaired glucose tolerance, or impaired fasting glucose on previous testing No First-degree relative with diabetes No High-risk race/ethnicity (eg, African American, Latino, Native American, [place] American, Pacific Islander) Yes History of cardiovascular disease No Hypertension or on therapy for hypertension No High-density lipoprotein cholesterol level <35 mg/dL (8.18 mmol/L) and/or a triglyceride level >250 mg/dL (2.99 mmol/L) No Polycystic ovary syndrome No Physical inactivity No Other clinical condition associated with insulin resistance (eg, severe obesity, acanthosis nigricans) No Previous birth of an infant weighing ?4000 g No Previous stillbirth of unknown cause No Exam   Vitals:   06/14/21 1353  BP: 114/80  Pulse: (!) 112  Weight: 197 lb (89.4 kg)      Uterus:     Pelvic Exam: Perineum: no hemorrhoids, normal perineum   Vulva: normal external genitalia, no lesions   Vagina:  normal mucosa, normal discharge   Cervix: no  lesions and normal, pap smear done.    Adnexa: normal adnexa and no mass, fullness, tenderness   Bony Pelvis: average  System: General: well-developed, well-nourished female in no acute distress   Breast:  normal appearance, no masses or tenderness   Skin: normal coloration and turgor, no rashes   Neurologic: oriented, normal, negative, normal mood   Extremities: normal strength, tone, and muscle  mass, ROM of all joints is normal   HEENT PERRLA, extraocular movement intact and sclera clear, anicteric   Mouth/Teeth mucous membranes moist, pharynx normal without lesions and dental hygiene good   Neck supple and no masses   Cardiovascular: regular rate and rhythm   Respiratory:  no respiratory distress, normal breath sounds   Abdomen: soft, non-tender; bowel sounds normal; no masses,  no organomegaly     Assessment:   Pregnancy: G2P0100 Patient Active Problem List   Diagnosis Date Noted   ASCUS of cervix with negative high risk HPV 06/14/2021   Hyperemesis gravidarum, antepartum 06/14/2021   History of incompetent cervix, currently pregnant 06/14/2021   Supervision of high risk pregnancy, antepartum 06/13/2021     Plan:  1. Supervision of other normal pregnancy, antepartum --Anticipatory guidance about next visits/weeks of pregnancy given. --Next visit with MD in 4 weeks  - Culture, OB Urine - Genetic Screening - Obstetric Panel, Including HIV - Hepatitis C Antibody - Cervicovaginal ancillary only( [place])  - Prenat-FeCbn-FeAspGl-FA-Omega (OB COMPLETE PETITE) 35-5-1-200 MG CAPS; Take 1 capsule by mouth daily.  Dispense: 30 capsule; Refill: 10 - aspirin EC 81 MG tablet; Take 1 tablet (81 mg total) by mouth daily. Swallow whole.  Dispense: 30 tablet; Refill: 5  2. [redacted] weeks gestation of pregnancy --By early [place], gestational sac only.  Dating [place] in office ordered.  3. Nausea and vomiting during pregnancy prior to [redacted] weeks gestation --Pt with vomiting 8-9 times daily and weight loss. See hyperemesis.  4. ASCUS of cervix with negative high risk HPV --Results from 01/12/2020.  Repeat in 3 years per ASCCP guidelines  5. History of incompetent cervix, currently pregnant --Delivery at 21 weeks --Serial cervical lengths --Consider progesterone, vaginal vs 17-P. Consult MFM.  - [place] MFM OB Transvaginal; Future  6. Hyperemesis gravidarum, antepartum --Recent weight loss  and frequency of vomiting 8-9 times daily --Will add 2 new medications that do not require medication stay down orally, scopolamine patch and Zofran ODT. Pt instructed to take Phenergan vaginally when not tolerating PO.   --F/U in 4 weeks or sooner as needed - scopolamine (TRANSDERM-SCOP, 1.5 MG,) 1 MG/3DAYS; Place 1 patch (1.5 mg total) onto the skin every 3 (three) days.  Dispense: 10 patch; Refill: 12 - ondansetron (ZOFRAN ODT) 4 MG disintegrating tablet; Take 1 tablet (4 mg total) by mouth every 8 (eight) hours as needed for nausea or vomiting.  Dispense: 20 tablet; Refill: 3 - promethazine (PHENERGAN) 12.5 MG tablet; Take 1-2 tablets (12.5-25 mg total) by mouth at bedtime as needed for nausea or vomiting. You may place pills vaginally if you cannot tolerate oral medications.  Dispense: 30 tablet; Refill: 0  7. Obesity affecting pregnancy, antepartum  - aspirin EC 81 MG tablet; Take 1 tablet (81 mg total) by mouth daily. Swallow whole.  Dispense: 30 tablet; Refill: 5     Initial labs drawn. Continue prenatal vitamins. Discussed and offered genetic screening options, including Quad screen/AFP, NIPS testing, and option to decline testing. Benefits/risks/alternatives reviewed. Pt aware that anatomy US is form of genetic screening with lower accuracy in  detecting trisomies than blood work.  Pt chooses genetic screening today. NIPS: requested. Ultrasound discussed; fetal anatomic survey: ordered. Problem list reviewed and updated. The nature of [facility] with multiple MDs and other Advanced Practice Providers was explained to patient; also emphasized that residents, students are part of our team. Routine obstetric precautions reviewed. Return in about 4 weeks (around 07/12/2021).    Sharen Counter, CNM 06/14/21 6:28 PM

## 2021-06-14 NOTE — Progress Notes (Signed)
New OB, c/o NV, losing weight. Needs Rx for PNV and NV.

## 2021-06-15 LAB — OBSTETRIC PANEL, INCLUDING HIV
Antibody Screen: NEGATIVE
Basophils Absolute: 0 10*3/uL (ref 0.0–0.2)
Basos: 0 %
EOS (ABSOLUTE): 0 10*3/uL (ref 0.0–0.4)
Eos: 0 %
HIV Screen 4th Generation wRfx: NONREACTIVE
Hematocrit: 41.3 % (ref 34.0–46.6)
Hemoglobin: 13.8 g/dL (ref 11.1–15.9)
Hepatitis B Surface Ag: NEGATIVE
Immature Grans (Abs): 0 10*3/uL (ref 0.0–0.1)
Immature Granulocytes: 0 %
Lymphocytes Absolute: 2.3 10*3/uL (ref 0.7–3.1)
Lymphs: 23 %
MCH: 28.9 pg (ref 26.6–33.0)
MCHC: 33.4 g/dL (ref 31.5–35.7)
MCV: 87 fL (ref 79–97)
Monocytes Absolute: 0.4 10*3/uL (ref 0.1–0.9)
Monocytes: 4 %
Neutrophils Absolute: 7.4 10*3/uL — ABNORMAL HIGH (ref 1.4–7.0)
Neutrophils: 73 %
Platelets: 349 10*3/uL (ref 150–450)
RBC: 4.77 x10E6/uL (ref 3.77–5.28)
RDW: 13.2 % (ref 11.7–15.4)
RPR Ser Ql: NONREACTIVE
Rh Factor: POSITIVE
Rubella Antibodies, IGG: 6.29 index (ref >0.99)
WBC: 10.2 10*3/uL (ref 3.4–10.8)

## 2021-06-15 LAB — CERVICOVAGINAL ANCILLARY ONLY
Chlamydia: NEGATIVE
Comment: NEGATIVE
Comment: NEGATIVE
Comment: NORMAL
Neisseria Gonorrhea: NEGATIVE
Trichomonas: NEGATIVE

## 2021-06-15 LAB — HEPATITIS C ANTIBODY: Hep C Virus Ab: <0.1 s/co ratio (ref 0.0–0.9)

## 2021-06-15 NOTE — Addendum Note (Signed)
Addended by: Sharen Counter A on: 06/15/2021 09:32 PM   Modules accepted: Orders

## 2021-06-16 LAB — URINE CULTURE, OB REFLEX

## 2021-06-16 LAB — CULTURE, OB URINE

## 2021-06-22 ENCOUNTER — Encounter: Payer: Self-pay | Admitting: Advanced Practice Midwife

## 2021-06-22 ENCOUNTER — Ambulatory Visit: Payer: Medicaid Other

## 2021-06-27 ENCOUNTER — Encounter: Payer: Self-pay | Admitting: Advanced Practice Midwife

## 2021-07-06 ENCOUNTER — Other Ambulatory Visit: Payer: Self-pay | Admitting: Advanced Practice Midwife

## 2021-07-06 ENCOUNTER — Ambulatory Visit: Payer: Medicaid Other

## 2021-07-06 ENCOUNTER — Ambulatory Visit: Payer: Medicaid Other | Admitting: *Deleted

## 2021-07-06 ENCOUNTER — Ambulatory Visit: Payer: Medicaid Other | Attending: Advanced Practice Midwife

## 2021-07-06 ENCOUNTER — Encounter: Payer: Self-pay | Admitting: *Deleted

## 2021-07-06 ENCOUNTER — Other Ambulatory Visit: Payer: Self-pay

## 2021-07-06 VITALS — BP 127/71 | HR 92

## 2021-07-06 DIAGNOSIS — O0990 Supervision of high risk pregnancy, unspecified, unspecified trimester: Secondary | ICD-10-CM | POA: Insufficient documentation

## 2021-07-06 DIAGNOSIS — O09299 Supervision of pregnancy with other poor reproductive or obstetric history, unspecified trimester: Secondary | ICD-10-CM

## 2021-07-12 ENCOUNTER — Encounter: Payer: Medicaid Other | Admitting: Obstetrics & Gynecology

## 2021-07-14 ENCOUNTER — Other Ambulatory Visit: Payer: Self-pay

## 2021-07-14 ENCOUNTER — Ambulatory Visit (INDEPENDENT_AMBULATORY_CARE_PROVIDER_SITE_OTHER): Payer: Medicaid Other

## 2021-07-14 VITALS — BP 98/69 | HR 93 | Wt 209.4 lb

## 2021-07-14 DIAGNOSIS — O99210 Obesity complicating pregnancy, unspecified trimester: Secondary | ICD-10-CM

## 2021-07-14 DIAGNOSIS — Z3A17 17 weeks gestation of pregnancy: Secondary | ICD-10-CM

## 2021-07-14 DIAGNOSIS — O0990 Supervision of high risk pregnancy, unspecified, unspecified trimester: Secondary | ICD-10-CM

## 2021-07-14 DIAGNOSIS — O09299 Supervision of pregnancy with other poor reproductive or obstetric history, unspecified trimester: Secondary | ICD-10-CM

## 2021-07-14 NOTE — Progress Notes (Signed)
   HIGH-RISK PREGNANCY OFFICE VISIT  Patient name: Olivia Taylor MRN [id number]  Date of birth: [birth year] Chief Complaint:   Routine Prenatal Visit  Subjective:   Olivia Taylor is a 25 y.o. G87P0100 female at [redacted]w[redacted]d with an Estimated Date of Delivery: 12/19/21 being seen today for ongoing management of a high-risk pregnancy aeb has Supervision of high risk pregnancy, antepartum; ASCUS of cervix with negative high risk HPV; Hyperemesis gravidarum, antepartum; and History of incompetent cervix, currently pregnant on their problem list.  Patient presents today with no complaints.  Patient endorses fetal movement. Patient denies abdominal cramping or contractions.  Patient denies vaginal concerns including abnormal discharge, leaking of fluid, and bleeding.  Contractions: Not present. Vag. Bleeding: None.   .  Reviewed past medical,surgical, social, obstetrical and family history as well as problem list, medications and allergies.  Objective   Vitals:   07/14/21 1106  BP: 98/69  Pulse: 93  Weight: 209 lb 6.4 oz (95 kg)  Body mass index is 39.57 kg/m.  Total Weight Gain:-13 lb 9.6 oz (-6.169 kg)         Physical Examination:   General appearance: Well appearing, and in no distress  Mental status: Alert, oriented to person, place, and time  Skin: Warm & dry  Cardiovascular: Normal heart rate noted  Respiratory: Normal respiratory effort, no distress  Abdomen: Soft, gravid, nontender,  Pelvic: Cervical exam deferred           Extremities: Edema: None  Fetal Status: Fetal Heart Rate (bpm): 148      No results found for this or any previous visit (from the past 24 hour(s)).  Assessment & Plan:  High-risk pregnancy of a 25 y.o., G2P0100 at [redacted]w[redacted]d with an Estimated Date of Delivery: 12/19/21   1. Supervision of high risk pregnancy, antepartum -Anticipatory guidance for upcoming appts. -Patient to schedule next appt in 4-5 weeks for a virtual or in-person visit.  2. [redacted] weeks  gestation of pregnancy -Doing well. -AFP collected today. -Reports nausea has improved with Zofran and scopalamine dosing. -Small rash noted behind right ear s/t patch. -Informed that likely from adhesive.  Discussed usage of cortisone cream/ointment on area. -Encouraged to rotate patch to other sides including shoulders and upper arms as tolerated.   3. History of incompetent cervix, currently pregnant -Next [place] on August 10th -Initial CL of 3.13  4. Obesity affecting pregnancy, antepartum -Has not obtained baby aspirin prescription -TWG (-13lbs) -Encouraged to start baby aspirin.    Meds: No orders of the defined types were placed in this encounter.  Labs/procedures today:  Lab Orders  No laboratory test(s) ordered today     Reviewed: Preterm labor symptoms and general obstetric precautions including but not limited to vaginal bleeding, contractions, leaking of fluid and fetal movement were reviewed in detail with the patient.  All questions were answered.  Follow-up: No follow-ups on file.  No orders of the defined types were placed in this encounter.  Cherre Robins MSN, CNM 07/14/2021

## 2021-07-14 NOTE — Progress Notes (Signed)
Pt reports that she is not feeling fetal movement yet, denies pain.

## 2021-07-16 LAB — AFP, SERUM, OPEN SPINA BIFIDA
AFP MoM: 0.62
AFP Value: 22.3 ng/mL
Gest. Age on Collection Date: 17 weeks
Maternal Age At EDD: 26.6 yr
OSBR Risk 1 IN: 10000
Test Results:: NEGATIVE
Weight: 209 [lb_av]

## 2021-07-27 ENCOUNTER — Other Ambulatory Visit: Payer: Self-pay | Admitting: Advanced Practice Midwife

## 2021-07-27 ENCOUNTER — Ambulatory Visit: Payer: 59 | Attending: Advanced Practice Midwife

## 2021-07-27 ENCOUNTER — Ambulatory Visit: Payer: 59 | Admitting: *Deleted

## 2021-07-27 ENCOUNTER — Other Ambulatory Visit: Payer: Self-pay

## 2021-07-27 ENCOUNTER — Ambulatory Visit (HOSPITAL_BASED_OUTPATIENT_CLINIC_OR_DEPARTMENT_OTHER): Payer: 59 | Admitting: Obstetrics and Gynecology

## 2021-07-27 VITALS — BP 106/66 | HR 99

## 2021-07-27 DIAGNOSIS — Z3A19 19 weeks gestation of pregnancy: Secondary | ICD-10-CM | POA: Diagnosis present

## 2021-07-27 DIAGNOSIS — O3432 Maternal care for cervical incompetence, second trimester: Secondary | ICD-10-CM | POA: Diagnosis present

## 2021-07-27 DIAGNOSIS — O99212 Obesity complicating pregnancy, second trimester: Secondary | ICD-10-CM | POA: Insufficient documentation

## 2021-07-27 DIAGNOSIS — O0990 Supervision of high risk pregnancy, unspecified, unspecified trimester: Secondary | ICD-10-CM | POA: Insufficient documentation

## 2021-07-27 NOTE — Progress Notes (Signed)
Maternal-Fetal Medicine   Name: Olivia Taylor DOB: [birth year] MRN: [id number] Referring Provider: Sharen Counter, CNM  I had the pleasure of seeing Olivia Taylor today at [facility] for [facility]. She is G2 P0010 at 19-2 weeks' gestation and is here for fetal anatomy scan.  On cell free fetal DNA screening, the risks of fetal aneuploidies are not increased.  Obstetrical history is significant for 21-week pregnancy loss in April 2021 in [place], [place].  Patient had abdominal pain and was evaluated at her office.  She was reassured that the cervix was closed.  On the following day, she had severe back pain and on evaluation the cervix was completely dilated.  She had a spontaneous miscarriage of a nonviable female infant. GYN history: No history of abnormal Pap smears or cervical surgeries.  Past medical history: No history of diabetes or hypertension or any chronic medical conditions. Past surgical history: Nil of note. Medications: Prenatal vitamins. Allergies: No known drug allergies. Social history: Denies tobacco or drug or alcohol use.  She lives with her partner.  He is an Tree surgeon and is in good health. Family history: No history of venous thromboembolism in the family. Blood pressure today at her office is 106/66 mmHg.  Ultrasound We performed a fetal anatomical survey.  Right urinary tract dilation (7 mm) is seen.  The left renal pelvis appears normal.  Both kidneys appear normal with no increased echogenicities.  Amniotic fluid is normal and good fetal activity seen.  Fetal biometry is consistent with the previously established dates.  Cardiac anatomy could not be evaluated because of fetal position.  We performed a transvaginal ultrasound because of a history of midtrimester pregnancy loss.  The cervix measures 4.3 cm, which is normal.  No shortening or funneling was seen on transfundal pressure.  I counseled the patient on the  following History of second trimester pregnancy loss -Based on her history, the most likely diagnosis is cervical incompetence.  I explained cervical incompetence with the help of diagrams.  History of cervical incompetence increases the risk of recurrence of pregnancy loss in this pregnancy. -I discussed the option of prophylactic cervical cerclage.  I explained the procedure and possible complications. -Alternatively, she can have a weekly ultrasound assessment of cervical length measurements and performed rescue cerclage if cervical length measurement is below 2.5 cm. Patient opted to have weekly cervical length measurements.  Urinary tract dilation I explained the finding of UTD.  Given that she had low risk for Down syndrome on cell free fetal DNA screening, this should be considered a normal variant and not a marker for Down syndrome. Most UTD resolves with advancing gestation or after delivery and are only rarely associated with obstructive uropathy.  Recommendations -Appointments were made for her to return for weekly cervical length measurements.  Thank you for consultation.  If you have any questions or concerns, please contact me the [facility].  Consultation including face-to-face (more than 50%) counseling 30 minutes.

## 2021-07-28 ENCOUNTER — Encounter: Payer: Self-pay | Admitting: Advanced Practice Midwife

## 2021-07-28 ENCOUNTER — Other Ambulatory Visit: Payer: Self-pay | Admitting: *Deleted

## 2021-07-28 DIAGNOSIS — O358XX0 Maternal care for other (suspected) fetal abnormality and damage, not applicable or unspecified: Secondary | ICD-10-CM | POA: Insufficient documentation

## 2021-07-28 DIAGNOSIS — O3430 Maternal care for cervical incompetence, unspecified trimester: Secondary | ICD-10-CM

## 2021-07-28 DIAGNOSIS — O35EXX0 Maternal care for other (suspected) fetal abnormality and damage, fetal genitourinary anomalies, not applicable or unspecified: Secondary | ICD-10-CM | POA: Insufficient documentation

## 2021-08-04 ENCOUNTER — Other Ambulatory Visit: Payer: Self-pay

## 2021-08-04 ENCOUNTER — Encounter: Payer: Self-pay | Admitting: *Deleted

## 2021-08-04 ENCOUNTER — Ambulatory Visit: Payer: Medicaid Other | Attending: Obstetrics and Gynecology

## 2021-08-04 ENCOUNTER — Other Ambulatory Visit: Payer: Self-pay | Admitting: Obstetrics and Gynecology

## 2021-08-04 ENCOUNTER — Ambulatory Visit: Payer: Medicaid Other | Admitting: *Deleted

## 2021-08-04 VITALS — BP 93/62 | HR 101

## 2021-08-04 DIAGNOSIS — O358XX0 Maternal care for other (suspected) fetal abnormality and damage, not applicable or unspecified: Secondary | ICD-10-CM

## 2021-08-04 DIAGNOSIS — O99212 Obesity complicating pregnancy, second trimester: Secondary | ICD-10-CM

## 2021-08-04 DIAGNOSIS — Z363 Encounter for antenatal screening for malformations: Secondary | ICD-10-CM

## 2021-08-04 DIAGNOSIS — O3432 Maternal care for cervical incompetence, second trimester: Secondary | ICD-10-CM

## 2021-08-04 DIAGNOSIS — O3430 Maternal care for cervical incompetence, unspecified trimester: Secondary | ICD-10-CM | POA: Diagnosis not present

## 2021-08-04 DIAGNOSIS — O35EXX0 Maternal care for other (suspected) fetal abnormality and damage, fetal genitourinary anomalies, not applicable or unspecified: Secondary | ICD-10-CM

## 2021-08-04 DIAGNOSIS — O0990 Supervision of high risk pregnancy, unspecified, unspecified trimester: Secondary | ICD-10-CM | POA: Insufficient documentation

## 2021-08-04 DIAGNOSIS — O132 Gestational [pregnancy-induced] hypertension without significant proteinuria, second trimester: Secondary | ICD-10-CM

## 2021-08-04 DIAGNOSIS — O09212 Supervision of pregnancy with history of pre-term labor, second trimester: Secondary | ICD-10-CM

## 2021-08-04 DIAGNOSIS — Z3A20 20 weeks gestation of pregnancy: Secondary | ICD-10-CM

## 2021-08-10 ENCOUNTER — Other Ambulatory Visit: Payer: Self-pay

## 2021-08-10 ENCOUNTER — Ambulatory Visit: Payer: Medicaid Other

## 2021-08-11 ENCOUNTER — Ambulatory Visit (INDEPENDENT_AMBULATORY_CARE_PROVIDER_SITE_OTHER): Payer: Medicaid Other

## 2021-08-11 ENCOUNTER — Other Ambulatory Visit: Payer: Self-pay

## 2021-08-11 VITALS — BP 117/79 | HR 95 | Wt 208.0 lb

## 2021-08-11 DIAGNOSIS — O219 Vomiting of pregnancy, unspecified: Secondary | ICD-10-CM

## 2021-08-11 DIAGNOSIS — O09299 Supervision of pregnancy with other poor reproductive or obstetric history, unspecified trimester: Secondary | ICD-10-CM

## 2021-08-11 DIAGNOSIS — Z3A21 21 weeks gestation of pregnancy: Secondary | ICD-10-CM

## 2021-08-11 DIAGNOSIS — O0990 Supervision of high risk pregnancy, unspecified, unspecified trimester: Secondary | ICD-10-CM

## 2021-08-11 NOTE — Progress Notes (Signed)
   HIGH-RISK PREGNANCY OFFICE VISIT  Patient name: Olivia Taylor MRN [id number]  Date of birth: 06-09-1996 Chief Complaint:   Routine Prenatal Visit  Subjective:   Olivia Taylor is a 25 y.o. G48P0100 female at [redacted]w[redacted]d with an Estimated Date of Delivery: 12/19/21 being seen today for ongoing management of a high-risk pregnancy aeb has Supervision of high risk pregnancy, antepartum; ASCUS of cervix with negative high risk HPV; Hyperemesis gravidarum, antepartum; History of incompetent cervix, currently pregnant; and Pyelectasis of fetus on prenatal ultrasound on their problem list.  Patient presents today with nausea. She reports she eased off her medications, but the symptoms are returning.  She reports she takes phenergan, zofran, and scopolamine patch but that they cost about $100.  Patient endorses fetal movement. Patient denies abdominal cramping or contractions.  Patient denies vaginal concerns including abnormal discharge, leaking of fluid, and bleeding.  Contractions: Not present. Vag. Bleeding: None.  Movement: Present.  Reviewed past medical,surgical, social, obstetrical and family history as well as problem list, medications and allergies.  Objective   Vitals:   08/11/21 1406  BP: 117/79  Pulse: 95  Weight: 208 lb (94.3 kg)  Body mass index is 39.3 kg/m.  Total Weight Gain:-15 lb (-6.804 kg)         Physical Examination:   General appearance: Well appearing, and in no distress  Mental status: Alert, oriented to person, place, and time  Skin: Warm & dry  Cardiovascular: Normal heart rate noted  Respiratory: Normal respiratory effort, no distress  Abdomen: Soft, gravid, nontender, AGA with Fundus at umbilicus  Pelvic: Cervical exam deferred           Extremities: Edema: None  Fetal Status: Fetal Heart Rate (bpm): 147  Movement: Present   No results found for this or any previous visit (from the past 24 hour(s)).  Assessment & Plan:  High-risk pregnancy of a 25  y.o., G2P0100 at [redacted]w[redacted]d with an Estimated Date of Delivery: 12/19/21   1. Supervision of high risk pregnancy, antepartum -Anticipatory guidance for upcoming appts. -Patient to schedule next appt in 1 weeks for an in-person visit.   2. [redacted] weeks gestation of pregnancy -Doing well.  -Taking bASA as prescribed  3. History of incompetent cervix, currently pregnant -Reviewed [place] from 8/18 -CL 3.61cm -Reviewed findings and concerns regarding fetal pyelectasis. -Reassured that common finding and usually resolves spontaneously.   4. Nausea and vomiting during pregnancy prior to [redacted] weeks gestation -Discussed management of symptoms. -encouraged gradual cessation of medications. -Discussed and given photographic examples of C-Bands for nausea. -Informed that prescriptions should be free or ~$4 with Medicaid. -Instructed to follow up with her pharmacy regarding proper insurance data and costs.     Meds: No orders of the defined types were placed in this encounter.  Labs/procedures today:  Lab Orders  No laboratory test(s) ordered today     Reviewed: Preterm labor symptoms and general obstetric precautions including but not limited to vaginal bleeding, contractions, leaking of fluid and fetal movement were reviewed in detail with the patient.  All questions were answered.  Follow-up: No follow-ups on file.  No orders of the defined types were placed in this encounter.  Cherre Robins MSN, CNM 08/11/2021

## 2021-08-17 ENCOUNTER — Ambulatory Visit: Payer: Medicaid Other | Attending: Obstetrics and Gynecology

## 2021-08-17 ENCOUNTER — Other Ambulatory Visit: Payer: Self-pay | Admitting: Obstetrics and Gynecology

## 2021-08-17 ENCOUNTER — Ambulatory Visit: Payer: Medicaid Other | Admitting: *Deleted

## 2021-08-17 ENCOUNTER — Encounter: Payer: Self-pay | Admitting: *Deleted

## 2021-08-17 ENCOUNTER — Other Ambulatory Visit: Payer: Self-pay

## 2021-08-17 VITALS — BP 99/59 | HR 101

## 2021-08-17 DIAGNOSIS — O0990 Supervision of high risk pregnancy, unspecified, unspecified trimester: Secondary | ICD-10-CM

## 2021-08-17 DIAGNOSIS — O3430 Maternal care for cervical incompetence, unspecified trimester: Secondary | ICD-10-CM | POA: Diagnosis present

## 2021-08-17 DIAGNOSIS — E669 Obesity, unspecified: Secondary | ICD-10-CM | POA: Diagnosis not present

## 2021-08-17 DIAGNOSIS — Z3A22 22 weeks gestation of pregnancy: Secondary | ICD-10-CM

## 2021-08-17 DIAGNOSIS — O358XX0 Maternal care for other (suspected) fetal abnormality and damage, not applicable or unspecified: Secondary | ICD-10-CM

## 2021-08-17 DIAGNOSIS — O99212 Obesity complicating pregnancy, second trimester: Secondary | ICD-10-CM

## 2021-08-17 DIAGNOSIS — Z0372 Encounter for suspected placental problem ruled out: Secondary | ICD-10-CM

## 2021-08-17 DIAGNOSIS — O09212 Supervision of pregnancy with history of pre-term labor, second trimester: Secondary | ICD-10-CM

## 2021-08-17 DIAGNOSIS — O35EXX0 Maternal care for other (suspected) fetal abnormality and damage, fetal genitourinary anomalies, not applicable or unspecified: Secondary | ICD-10-CM

## 2021-08-24 ENCOUNTER — Encounter: Payer: Self-pay | Admitting: *Deleted

## 2021-08-24 ENCOUNTER — Other Ambulatory Visit: Payer: Self-pay | Admitting: Advanced Practice Midwife

## 2021-08-24 ENCOUNTER — Telehealth: Payer: Self-pay | Admitting: *Deleted

## 2021-08-24 DIAGNOSIS — O210 Mild hyperemesis gravidarum: Secondary | ICD-10-CM

## 2021-08-24 MED ORDER — SCOPOLAMINE 1 MG/3DAYS TD PT72: 1.0000 | MEDICATED_PATCH | TRANSDERMAL | 2 refills | Status: DC

## 2021-08-24 MED ORDER — PROMETHAZINE HCL 12.5 MG PO TABS: 12.5000 mg | ORAL_TABLET | Freq: Every evening | ORAL | 2 refills | Status: DC | PRN

## 2021-08-24 NOTE — Telephone Encounter (Signed)
Pt called to office stating she needs Rx for her scope patch- ?PA. Pt also needs refill on promethazine, will send msg to provider for approval.  Pt made aware she will be contacted and made aware of Rx's.      Please review and send new Rx for Promethazine if approved.

## 2021-08-25 ENCOUNTER — Ambulatory Visit: Payer: Medicaid Other

## 2021-09-01 ENCOUNTER — Telehealth: Payer: Medicaid Other

## 2021-09-01 DIAGNOSIS — O0990 Supervision of high risk pregnancy, unspecified, unspecified trimester: Secondary | ICD-10-CM

## 2021-09-01 NOTE — Progress Notes (Signed)
Patient called by MA and no answer.  Link sent and provider actively waiting for >15 minutes with no patient arrival.  Front office staff to call and reschedule accordingly.  Cherre Robins MSN, CNM Advanced Practice Provider, [facility]

## 2021-09-02 ENCOUNTER — Telehealth: Payer: Self-pay

## 2021-09-02 NOTE — Telephone Encounter (Signed)
PA completed for scopolamine 1 mg.

## 2021-09-12 ENCOUNTER — Ambulatory Visit: Payer: Medicaid Other

## 2021-09-12 ENCOUNTER — Ambulatory Visit: Payer: Medicaid Other | Attending: Obstetrics and Gynecology

## 2021-09-12 ENCOUNTER — Encounter: Payer: Self-pay | Admitting: *Deleted

## 2021-09-12 ENCOUNTER — Other Ambulatory Visit: Payer: Self-pay | Admitting: *Deleted

## 2021-09-12 DIAGNOSIS — O210 Mild hyperemesis gravidarum: Secondary | ICD-10-CM

## 2021-09-12 MED ORDER — ONDANSETRON 4 MG PO TBDP: 4.0000 mg | ORAL_TABLET | Freq: Three times a day (TID) | ORAL | 3 refills | Status: DC | PRN

## 2021-09-16 ENCOUNTER — Telehealth: Payer: Self-pay | Admitting: *Deleted

## 2021-09-16 NOTE — Telephone Encounter (Signed)
TC from patient regarding blood streaked spit mixed with mucous.  Patient with recent history of ptyalism, recent post nasal drainage, and hx of nose bleed in the past. Denies large volume of blood or signs for life threatening emergency. Recommended Zyrtec or Claritin from pregnancy safe meds list to help with postnasal drip which may be causing irritation and small amt of bleeding. Patient instructed to seek care for large volume of bleeding or other signs of life threatening emergency. Patinet verbalized understanding.

## 2021-09-21 ENCOUNTER — Other Ambulatory Visit: Payer: Self-pay

## 2021-09-21 MED ORDER — TRANSDERM-SCOP (1.5 MG) 1 MG/3DAYS TD PT72: 1.0000 | MEDICATED_PATCH | TRANSDERMAL | 2 refills | Status: DC

## 2021-09-22 ENCOUNTER — Other Ambulatory Visit: Payer: Medicaid Other

## 2021-09-22 ENCOUNTER — Encounter: Payer: Medicaid Other | Admitting: Obstetrics

## 2021-09-27 ENCOUNTER — Ambulatory Visit: Payer: Medicaid Other | Attending: Obstetrics and Gynecology

## 2021-09-27 ENCOUNTER — Other Ambulatory Visit: Payer: Self-pay

## 2021-09-27 ENCOUNTER — Encounter: Payer: Self-pay | Admitting: *Deleted

## 2021-09-27 ENCOUNTER — Ambulatory Visit: Payer: Medicaid Other | Admitting: *Deleted

## 2021-09-27 ENCOUNTER — Other Ambulatory Visit: Payer: Self-pay | Admitting: *Deleted

## 2021-09-27 VITALS — BP 97/66 | HR 107

## 2021-09-27 DIAGNOSIS — O0990 Supervision of high risk pregnancy, unspecified, unspecified trimester: Secondary | ICD-10-CM | POA: Insufficient documentation

## 2021-09-27 DIAGNOSIS — O35EXX0 Maternal care for other (suspected) fetal abnormality and damage, fetal genitourinary anomalies, not applicable or unspecified: Secondary | ICD-10-CM | POA: Insufficient documentation

## 2021-09-27 DIAGNOSIS — O3433 Maternal care for cervical incompetence, third trimester: Secondary | ICD-10-CM

## 2021-09-27 DIAGNOSIS — O3430 Maternal care for cervical incompetence, unspecified trimester: Secondary | ICD-10-CM | POA: Diagnosis not present

## 2021-09-27 DIAGNOSIS — O09213 Supervision of pregnancy with history of pre-term labor, third trimester: Secondary | ICD-10-CM

## 2021-09-27 DIAGNOSIS — O99213 Obesity complicating pregnancy, third trimester: Secondary | ICD-10-CM | POA: Diagnosis not present

## 2021-09-27 DIAGNOSIS — E669 Obesity, unspecified: Secondary | ICD-10-CM

## 2021-09-27 DIAGNOSIS — Z3A28 28 weeks gestation of pregnancy: Secondary | ICD-10-CM

## 2021-10-04 ENCOUNTER — Other Ambulatory Visit: Payer: Self-pay

## 2021-10-04 ENCOUNTER — Other Ambulatory Visit: Payer: Medicaid Other

## 2021-10-04 ENCOUNTER — Ambulatory Visit (INDEPENDENT_AMBULATORY_CARE_PROVIDER_SITE_OTHER): Payer: Medicaid Other | Admitting: Obstetrics & Gynecology

## 2021-10-04 VITALS — BP 97/65 | HR 103 | Wt 211.0 lb

## 2021-10-04 DIAGNOSIS — Z3A29 29 weeks gestation of pregnancy: Secondary | ICD-10-CM

## 2021-10-04 DIAGNOSIS — O0990 Supervision of high risk pregnancy, unspecified, unspecified trimester: Secondary | ICD-10-CM

## 2021-10-04 DIAGNOSIS — O210 Mild hyperemesis gravidarum: Secondary | ICD-10-CM

## 2021-10-04 MED ORDER — PANTOPRAZOLE SODIUM 40 MG PO TBEC: 40.0000 mg | DELAYED_RELEASE_TABLET | Freq: Every day | ORAL | 1 refills | Status: DC

## 2021-10-04 NOTE — Progress Notes (Signed)
   PRENATAL VISIT NOTE  Subjective:  Olivia Taylor is a 25 y.o. G2P0100 at [redacted]w[redacted]d being seen today for ongoing prenatal care.  She is currently monitored for the following issues for this high-risk pregnancy and has Supervision of high risk pregnancy, antepartum; ASCUS of cervix with negative high risk HPV; Hyperemesis gravidarum, antepartum; History of incompetent cervix, currently pregnant; and Pyelectasis of fetus on prenatal ultrasound on their problem list.  Patient reports heartburn and nausea.  Contractions: Not present. Vag. Bleeding: None.  Movement: Present. Denies leaking of fluid.   The following portions of the patient's history were reviewed and updated as appropriate: allergies, current medications, past family history, past medical history, past social history, past surgical history and problem list.   Objective:   Vitals:   10/04/21 0856  BP: 97/65  Pulse: (!) 103  Weight: 211 lb (95.7 kg)    Fetal Status: Fetal Heart Rate (bpm): 141   Movement: Present     General:  Alert, oriented and cooperative. Patient is in no acute distress.  Skin: Skin is warm and dry. No rash noted.   Cardiovascular: Normal heart rate noted  Respiratory: Normal respiratory effort, no problems with respiration noted  Abdomen: Soft, gravid, appropriate for gestational age.  Pain/Pressure: Present     Pelvic: Cervical exam deferred        Extremities: Normal range of motion.  Edema: None  Mental Status: Normal mood and affect. Normal behavior. Normal judgment and thought content.   Assessment and Plan:  Pregnancy: G2P0100 at [redacted]w[redacted]d 1. Supervision of high risk pregnancy, antepartum Routine testing - Glucose Tolerance, 2 Hours w/1 Hour - RPR - CBC - HIV antibody (with reflex) - pantoprazole (PROTONIX) 40 MG tablet; Take 1 tablet (40 mg total) by mouth daily.  Dispense: 30 tablet; Refill: 1  2. [redacted] weeks gestation of pregnancy  - Glucose Tolerance, 2 Hours w/1 Hour - RPR - CBC -  HIV antibody (with reflex) Hyperemesis, add protonix Preterm labor symptoms and general obstetric precautions including but not limited to vaginal bleeding, contractions, leaking of fluid and fetal movement were reviewed in detail with the patient. Please refer to After Visit Summary for other counseling recommendations.   Return in about 2 weeks (around 10/18/2021).  Future Appointments  Date Time Provider Department Center  10/25/2021 11:00 AM [facility] NURSE [facility]  10/25/2021 11:15 AM [facility] US2 [facility]    Scheryl Darter, MD

## 2021-10-04 NOTE — Progress Notes (Signed)
ROB declined FLU and TDAP vaccines.

## 2021-10-05 LAB — CBC
Hematocrit: 30.7 % — ABNORMAL LOW (ref 34.0–46.6)
Hemoglobin: 10.4 g/dL — ABNORMAL LOW (ref 11.1–15.9)
MCH: 28.9 pg (ref 26.6–33.0)
MCHC: 33.9 g/dL (ref 31.5–35.7)
MCV: 85 fL (ref 79–97)
Platelets: 261 10*3/uL (ref 150–450)
RBC: 3.6 x10E6/uL — ABNORMAL LOW (ref 3.77–5.28)
RDW: 13.3 % (ref 11.7–15.4)
WBC: 10.4 10*3/uL (ref 3.4–10.8)

## 2021-10-05 LAB — GLUCOSE TOLERANCE, 2 HOURS W/ 1HR
Glucose, 1 hour: 107 mg/dL (ref 70–179)
Glucose, 2 hour: 117 mg/dL (ref 70–152)
Glucose, Fasting: 89 mg/dL (ref 70–91)

## 2021-10-05 LAB — HIV ANTIBODY (ROUTINE TESTING W REFLEX): HIV Screen 4th Generation wRfx: NONREACTIVE

## 2021-10-05 LAB — RPR: RPR Ser Ql: NONREACTIVE

## 2021-10-25 ENCOUNTER — Other Ambulatory Visit: Payer: Self-pay

## 2021-10-25 ENCOUNTER — Ambulatory Visit: Payer: Medicaid Other | Admitting: *Deleted

## 2021-10-25 ENCOUNTER — Ambulatory Visit: Payer: Medicaid Other | Attending: Obstetrics and Gynecology

## 2021-10-25 ENCOUNTER — Other Ambulatory Visit: Payer: Self-pay | Admitting: *Deleted

## 2021-10-25 VITALS — BP 101/59 | HR 93

## 2021-10-25 DIAGNOSIS — O0990 Supervision of high risk pregnancy, unspecified, unspecified trimester: Secondary | ICD-10-CM | POA: Diagnosis present

## 2021-10-25 DIAGNOSIS — O09213 Supervision of pregnancy with history of pre-term labor, third trimester: Secondary | ICD-10-CM

## 2021-10-25 DIAGNOSIS — O35EXX0 Maternal care for other (suspected) fetal abnormality and damage, fetal genitourinary anomalies, not applicable or unspecified: Secondary | ICD-10-CM

## 2021-10-25 DIAGNOSIS — Z3A32 32 weeks gestation of pregnancy: Secondary | ICD-10-CM

## 2021-10-25 DIAGNOSIS — Z6841 Body Mass Index (BMI) 40.0 and over, adult: Secondary | ICD-10-CM

## 2021-10-28 ENCOUNTER — Encounter: Payer: Medicaid Other | Admitting: Certified Nurse Midwife

## 2021-11-07 ENCOUNTER — Ambulatory Visit: Payer: Medicaid Other

## 2021-11-08 ENCOUNTER — Other Ambulatory Visit: Payer: Medicaid Other

## 2021-11-08 ENCOUNTER — Ambulatory Visit: Payer: Medicaid Other | Attending: Maternal & Fetal Medicine

## 2021-11-14 ENCOUNTER — Ambulatory Visit: Payer: Medicaid Other

## 2021-11-16 ENCOUNTER — Other Ambulatory Visit: Payer: Self-pay

## 2021-11-16 ENCOUNTER — Ambulatory Visit: Payer: Medicaid Other | Attending: Maternal & Fetal Medicine | Admitting: *Deleted

## 2021-11-16 ENCOUNTER — Encounter: Payer: Self-pay | Admitting: *Deleted

## 2021-11-16 ENCOUNTER — Ambulatory Visit (HOSPITAL_BASED_OUTPATIENT_CLINIC_OR_DEPARTMENT_OTHER): Payer: Medicaid Other

## 2021-11-16 VITALS — BP 106/84 | HR 93

## 2021-11-16 DIAGNOSIS — O35EXX0 Maternal care for other (suspected) fetal abnormality and damage, fetal genitourinary anomalies, not applicable or unspecified: Secondary | ICD-10-CM | POA: Insufficient documentation

## 2021-11-16 DIAGNOSIS — O09213 Supervision of pregnancy with history of pre-term labor, third trimester: Secondary | ICD-10-CM | POA: Diagnosis not present

## 2021-11-16 DIAGNOSIS — Z362 Encounter for other antenatal screening follow-up: Secondary | ICD-10-CM | POA: Diagnosis not present

## 2021-11-16 DIAGNOSIS — Z3A35 35 weeks gestation of pregnancy: Secondary | ICD-10-CM | POA: Insufficient documentation

## 2021-11-16 DIAGNOSIS — O09293 Supervision of pregnancy with other poor reproductive or obstetric history, third trimester: Secondary | ICD-10-CM | POA: Diagnosis present

## 2021-11-16 DIAGNOSIS — O99213 Obesity complicating pregnancy, third trimester: Secondary | ICD-10-CM | POA: Insufficient documentation

## 2021-11-16 DIAGNOSIS — Z6841 Body Mass Index (BMI) 40.0 and over, adult: Secondary | ICD-10-CM

## 2021-11-16 DIAGNOSIS — O0990 Supervision of high risk pregnancy, unspecified, unspecified trimester: Secondary | ICD-10-CM

## 2021-11-21 ENCOUNTER — Ambulatory Visit: Payer: Medicaid Other | Attending: Maternal & Fetal Medicine

## 2021-11-21 ENCOUNTER — Other Ambulatory Visit: Payer: Self-pay

## 2021-11-21 ENCOUNTER — Ambulatory Visit: Payer: Medicaid Other | Admitting: *Deleted

## 2021-11-21 ENCOUNTER — Encounter: Payer: Self-pay | Admitting: *Deleted

## 2021-11-21 VITALS — BP 120/81 | HR 84

## 2021-11-21 DIAGNOSIS — O35EXX0 Maternal care for other (suspected) fetal abnormality and damage, fetal genitourinary anomalies, not applicable or unspecified: Secondary | ICD-10-CM

## 2021-11-21 DIAGNOSIS — O09213 Supervision of pregnancy with history of pre-term labor, third trimester: Secondary | ICD-10-CM

## 2021-11-21 DIAGNOSIS — O99213 Obesity complicating pregnancy, third trimester: Secondary | ICD-10-CM

## 2021-11-21 DIAGNOSIS — Z3A36 36 weeks gestation of pregnancy: Secondary | ICD-10-CM | POA: Diagnosis not present

## 2021-11-21 DIAGNOSIS — E669 Obesity, unspecified: Secondary | ICD-10-CM

## 2021-11-21 DIAGNOSIS — Z6841 Body Mass Index (BMI) 40.0 and over, adult: Secondary | ICD-10-CM

## 2021-11-21 DIAGNOSIS — O09293 Supervision of pregnancy with other poor reproductive or obstetric history, third trimester: Secondary | ICD-10-CM | POA: Diagnosis not present

## 2021-11-21 DIAGNOSIS — O0990 Supervision of high risk pregnancy, unspecified, unspecified trimester: Secondary | ICD-10-CM

## 2021-11-22 ENCOUNTER — Encounter: Payer: Self-pay | Admitting: Nurse Practitioner

## 2021-11-22 ENCOUNTER — Ambulatory Visit (INDEPENDENT_AMBULATORY_CARE_PROVIDER_SITE_OTHER): Payer: Medicaid Other | Admitting: Nurse Practitioner

## 2021-11-22 ENCOUNTER — Other Ambulatory Visit (HOSPITAL_COMMUNITY)
Admission: RE | Admit: 2021-11-22 | Discharge: 2021-11-22 | Disposition: A | Discharge status: Home / Self Care | Payer: Medicaid Other | Source: Ambulatory Visit | Attending: Nurse Practitioner | Admitting: Nurse Practitioner

## 2021-11-22 VITALS — BP 111/79 | HR 79 | Wt 212.0 lb

## 2021-11-22 DIAGNOSIS — Z3A36 36 weeks gestation of pregnancy: Secondary | ICD-10-CM

## 2021-11-22 DIAGNOSIS — O0990 Supervision of high risk pregnancy, unspecified, unspecified trimester: Secondary | ICD-10-CM | POA: Diagnosis present

## 2021-11-22 DIAGNOSIS — O210 Mild hyperemesis gravidarum: Secondary | ICD-10-CM

## 2021-11-22 DIAGNOSIS — O35EXX0 Maternal care for other (suspected) fetal abnormality and damage, fetal genitourinary anomalies, not applicable or unspecified: Secondary | ICD-10-CM

## 2021-11-22 LAB — OB RESULTS CONSOLE GC/CHLAMYDIA: Gonorrhea: NEGATIVE

## 2021-11-22 MED ORDER — ONDANSETRON 4 MG PO TBDP: 4.0000 mg | ORAL_TABLET | Freq: Three times a day (TID) | ORAL | 3 refills | Status: DC | PRN

## 2021-11-22 NOTE — Progress Notes (Signed)
    Subjective:  Olivia Taylor is a 25 y.o. G2P0100 at [redacted]w[redacted]d being seen today for ongoing prenatal care.  She is currently monitored for the following issues for this high-risk pregnancy and has Supervision of high risk pregnancy, antepartum; ASCUS of cervix with negative high risk HPV; Hyperemesis gravidarum, antepartum; History of incompetent cervix, currently pregnant; and Pyelectasis of fetus on prenatal ultrasound on their problem list.  Patient reports  periodic numbness in left upper arm and left leg - none at this time .  Contractions: Not present. Vag. Bleeding: None.  Movement: Present. Denies leaking of fluid.   The following portions of the patient's history were reviewed and updated as appropriate: allergies, current medications, past family history, past medical history, past social history, past surgical history and problem list. Problem list updated.  Objective:   Vitals:   11/22/21 1056  BP: 111/79  Pulse: 79  Weight: 212 lb (96.2 kg)    Fetal Status: Fetal Heart Rate (bpm): 135   Movement: Present     General:  Alert, oriented and cooperative. Patient is in no acute distress.  Skin: Skin is warm and dry. No rash noted.   Cardiovascular: Normal heart rate noted  Respiratory: Normal respiratory effort, no problems with respiration noted  Abdomen: Soft, gravid, appropriate for gestational age. Pain/Pressure: Absent     Pelvic:  Cervical exam deferred       Vaginal swabs done  Extremities: Normal range of motion.     Mental Status: Normal mood and affect. Normal behavior. Normal judgment and thought content.   Urinalysis:      Assessment and Plan:  Pregnancy: G2P0100 at [redacted]w[redacted]d  1. Supervision of high risk pregnancy, antepartum Not seen in prenatal care since [facility] October.  Advised she will be seen weekly in the office and weekly at MFM for BPP. Reviewed activities that might cause strain on one side of the body - carrying purse on shoulder, repetitive motion  like sweeping, vacuuming or mopping. Strength tested in arms and legs - no changes noted on left side - equal strength noted in arms and legs MFM recommends delivery at 39 weeks due to size of baby - in 13% - and on low end of normal - will discuss at next visit. Vaginal swags done today.  No allergies.  - Culture, beta strep (group b only) - Cervicovaginal ancillary only( [place])  2. Hyperemesis gravidarum, antepartum Vomits almost daily - has not gained any weight this pregnancy Requesting refill of zofran  - ondansetron (ZOFRAN ODT) 4 MG disintegrating tablet; Take 1 tablet (4 mg total) by mouth every 8 (eight) hours as needed for nausea or vomiting.  Dispense: 20 tablet; Refill: 3  3. Pyelectasis of fetus on prenatal ultrasound Noted on [place] yesterday and MFM discussed with patient  4. [redacted] weeks gestation of pregnancy   Preterm labor symptoms and general obstetric precautions including but not limited to vaginal bleeding, contractions, leaking of fluid and fetal movement were reviewed in detail with the patient. Please refer to After Visit Summary for other counseling recommendations.  Return in about 1 week (around 11/29/2021) for in person ROB.  Nolene Bernheim, RN, MSN, NP-BC Nurse Practitioner, [facility] for [employer], [facility] 11/22/2021 11:23 AM

## 2021-11-22 NOTE — Progress Notes (Signed)
Pt states she is having N&V, request refill on Zofran. Pt does not use Promethazine or patch anymore.  Pt complains of numbness in her left leg and upper arm.

## 2021-11-23 ENCOUNTER — Other Ambulatory Visit: Payer: Self-pay | Admitting: *Deleted

## 2021-11-23 DIAGNOSIS — Z6841 Body Mass Index (BMI) 40.0 and over, adult: Secondary | ICD-10-CM

## 2021-11-23 DIAGNOSIS — O09899 Supervision of other high risk pregnancies, unspecified trimester: Secondary | ICD-10-CM

## 2021-11-23 DIAGNOSIS — O35EXX0 Maternal care for other (suspected) fetal abnormality and damage, fetal genitourinary anomalies, not applicable or unspecified: Secondary | ICD-10-CM

## 2021-11-23 LAB — CERVICOVAGINAL ANCILLARY ONLY
Chlamydia: NEGATIVE
Comment: NEGATIVE
Comment: NORMAL
Neisseria Gonorrhea: NEGATIVE

## 2021-11-26 LAB — CULTURE, BETA STREP (GROUP B ONLY): Strep Gp B Culture: POSITIVE — AB

## 2021-11-29 ENCOUNTER — Encounter: Payer: Self-pay | Admitting: Obstetrics and Gynecology

## 2021-11-29 ENCOUNTER — Other Ambulatory Visit: Payer: Self-pay | Admitting: Obstetrics and Gynecology

## 2021-11-29 ENCOUNTER — Ambulatory Visit (INDEPENDENT_AMBULATORY_CARE_PROVIDER_SITE_OTHER): Payer: Medicaid Other | Admitting: Obstetrics and Gynecology

## 2021-11-29 ENCOUNTER — Other Ambulatory Visit: Payer: Self-pay

## 2021-11-29 VITALS — BP 124/84 | HR 83 | Wt 218.6 lb

## 2021-11-29 DIAGNOSIS — B951 Streptococcus, group B, as the cause of diseases classified elsewhere: Secondary | ICD-10-CM | POA: Insufficient documentation

## 2021-11-29 DIAGNOSIS — O35EXX0 Maternal care for other (suspected) fetal abnormality and damage, fetal genitourinary anomalies, not applicable or unspecified: Secondary | ICD-10-CM

## 2021-11-29 DIAGNOSIS — O0990 Supervision of high risk pregnancy, unspecified, unspecified trimester: Secondary | ICD-10-CM

## 2021-11-29 NOTE — Progress Notes (Signed)
   PRENATAL VISIT NOTE  Subjective:  Olivia Taylor is a 25 y.o. G2P0100 at [redacted]w[redacted]d being seen today for ongoing prenatal care.  She is currently monitored for the following issues for this low-risk pregnancy and has Supervision of high risk pregnancy, antepartum; ASCUS of cervix with negative high risk HPV; History of incompetent cervix, currently pregnant; Pyelectasis of fetus on prenatal ultrasound; and Positive GBS test on their problem list.  Patient reports no complaints.  Contractions: Not present. Vag. Bleeding: None.  Movement: Present. Denies leaking of fluid.   The following portions of the patient's history were reviewed and updated as appropriate: allergies, current medications, past family history, past medical history, past social history, past surgical history and problem list.   Objective:   Vitals:   11/29/21 1134  BP: 124/84  Pulse: 83  Weight: 218 lb 9.6 oz (99.2 kg)    Fetal Status: Fetal Heart Rate (bpm): 154   Movement: Present     General:  Alert, oriented and cooperative. Patient is in no acute distress.  Skin: Skin is warm and dry. No rash noted.   Cardiovascular: Normal heart rate noted  Respiratory: Normal respiratory effort, no problems with respiration noted  Abdomen: Soft, gravid, appropriate for gestational age.  Pain/Pressure: Absent     Pelvic: Cervical exam deferred        Extremities: Normal range of motion.     Mental Status: Normal mood and affect. Normal behavior. Normal judgment and thought content.   Assessment and Plan:  Pregnancy: G2P0100 at [redacted]w[redacted]d 1. Supervision of high risk pregnancy, antepartum MFM recommended delivery at 39w due low normal growth.   Scheduled iol for 12/28 after discussing with the patient. EDC is 1/2 by 16w [place].  Last growth on 12/5 was low normal 13%ile, normal afi  2. Pyelectasis of fetus on prenatal ultrasound - Continued right hydro about 1.5 cm. Will notify peds at delivery  3. Positive GBS test PCN in  labor - discussed with pt   Term labor symptoms and general obstetric precautions including but not limited to vaginal bleeding, contractions, leaking of fluid and fetal movement were reviewed in detail with the patient. Please refer to After Visit Summary for other counseling recommendations.   Return in about 1 week (around 12/06/2021) for OB VISIT, MD or APP; will have induction on 12/28 so may schedule postpartum visit.  Future Appointments  Date Time Provider Department Center  12/01/2021  1:30 PM [facility] NURSE [facility]  12/01/2021  1:45 PM [facility] US4 [facility]  12/07/2021 12:30 PM [facility] NURSE [facility]  12/07/2021 12:45 PM [facility] US5 [facility]  12/09/2021 11:15 AM Warden Fillers, MD [facility] None  12/15/2021  1:30 PM [facility] NURSE [facility]  12/15/2021  1:45 PM [facility] US4 [facility]    Milas Hock, MD

## 2021-12-01 ENCOUNTER — Ambulatory Visit: Payer: Medicaid Other | Attending: Obstetrics

## 2021-12-01 ENCOUNTER — Ambulatory Visit: Payer: Medicaid Other | Admitting: *Deleted

## 2021-12-01 ENCOUNTER — Other Ambulatory Visit: Payer: Self-pay

## 2021-12-01 VITALS — BP 127/85 | HR 89

## 2021-12-01 DIAGNOSIS — Z3A37 37 weeks gestation of pregnancy: Secondary | ICD-10-CM

## 2021-12-01 DIAGNOSIS — O09893 Supervision of other high risk pregnancies, third trimester: Secondary | ICD-10-CM

## 2021-12-01 DIAGNOSIS — O09899 Supervision of other high risk pregnancies, unspecified trimester: Secondary | ICD-10-CM | POA: Insufficient documentation

## 2021-12-01 DIAGNOSIS — O0990 Supervision of high risk pregnancy, unspecified, unspecified trimester: Secondary | ICD-10-CM

## 2021-12-01 DIAGNOSIS — O35EXX0 Maternal care for other (suspected) fetal abnormality and damage, fetal genitourinary anomalies, not applicable or unspecified: Secondary | ICD-10-CM | POA: Diagnosis not present

## 2021-12-01 DIAGNOSIS — Z6841 Body Mass Index (BMI) 40.0 and over, adult: Secondary | ICD-10-CM | POA: Diagnosis present

## 2021-12-07 ENCOUNTER — Other Ambulatory Visit: Payer: Self-pay

## 2021-12-07 ENCOUNTER — Ambulatory Visit: Payer: Medicaid Other | Attending: Obstetrics

## 2021-12-07 ENCOUNTER — Other Ambulatory Visit: Payer: Self-pay | Admitting: Advanced Practice Midwife

## 2021-12-07 ENCOUNTER — Ambulatory Visit: Payer: Medicaid Other | Admitting: *Deleted

## 2021-12-07 VITALS — BP 123/86 | HR 85

## 2021-12-07 DIAGNOSIS — O0990 Supervision of high risk pregnancy, unspecified, unspecified trimester: Secondary | ICD-10-CM

## 2021-12-07 DIAGNOSIS — O09899 Supervision of other high risk pregnancies, unspecified trimester: Secondary | ICD-10-CM

## 2021-12-07 DIAGNOSIS — O35EXX0 Maternal care for other (suspected) fetal abnormality and damage, fetal genitourinary anomalies, not applicable or unspecified: Secondary | ICD-10-CM | POA: Insufficient documentation

## 2021-12-07 DIAGNOSIS — O09893 Supervision of other high risk pregnancies, third trimester: Secondary | ICD-10-CM

## 2021-12-07 DIAGNOSIS — Z6841 Body Mass Index (BMI) 40.0 and over, adult: Secondary | ICD-10-CM | POA: Diagnosis present

## 2021-12-07 DIAGNOSIS — E669 Obesity, unspecified: Secondary | ICD-10-CM

## 2021-12-07 DIAGNOSIS — O09213 Supervision of pregnancy with history of pre-term labor, third trimester: Secondary | ICD-10-CM

## 2021-12-07 DIAGNOSIS — Z3A38 38 weeks gestation of pregnancy: Secondary | ICD-10-CM

## 2021-12-07 NOTE — Progress Notes (Signed)
Pt c.o mild headache yesterday that was relieved by one tylenol.

## 2021-12-09 ENCOUNTER — Encounter: Payer: Medicaid Other | Admitting: Obstetrics and Gynecology

## 2021-12-13 ENCOUNTER — Inpatient Hospital Stay (HOSPITAL_COMMUNITY)
Admission: AD | Admit: 2021-12-13 | Discharge: 2021-12-16 | DRG: 807 | Disposition: A | Discharge status: Home / Self Care | Payer: Medicaid Other | Attending: Obstetrics and Gynecology | Admitting: Obstetrics and Gynecology

## 2021-12-13 ENCOUNTER — Inpatient Hospital Stay (HOSPITAL_COMMUNITY): Payer: Medicaid Other

## 2021-12-13 ENCOUNTER — Other Ambulatory Visit: Payer: Self-pay

## 2021-12-13 ENCOUNTER — Encounter (HOSPITAL_COMMUNITY): Payer: Self-pay | Admitting: Obstetrics and Gynecology

## 2021-12-13 DIAGNOSIS — O0990 Supervision of high risk pregnancy, unspecified, unspecified trimester: Secondary | ICD-10-CM

## 2021-12-13 DIAGNOSIS — O9952 Diseases of the respiratory system complicating childbirth: Secondary | ICD-10-CM | POA: Diagnosis present

## 2021-12-13 DIAGNOSIS — O9852 Other viral diseases complicating childbirth: Secondary | ICD-10-CM | POA: Diagnosis not present

## 2021-12-13 DIAGNOSIS — Z3490 Encounter for supervision of normal pregnancy, unspecified, unspecified trimester: Secondary | ICD-10-CM

## 2021-12-13 DIAGNOSIS — Z3A39 39 weeks gestation of pregnancy: Secondary | ICD-10-CM

## 2021-12-13 DIAGNOSIS — Z20822 Contact with and (suspected) exposure to covid-19: Secondary | ICD-10-CM | POA: Diagnosis present

## 2021-12-13 DIAGNOSIS — O26893 Other specified pregnancy related conditions, third trimester: Secondary | ICD-10-CM | POA: Diagnosis present

## 2021-12-13 DIAGNOSIS — O358XX0 Maternal care for other (suspected) fetal abnormality and damage, not applicable or unspecified: Secondary | ICD-10-CM | POA: Diagnosis present

## 2021-12-13 DIAGNOSIS — Z7982 Long term (current) use of aspirin: Secondary | ICD-10-CM | POA: Diagnosis not present

## 2021-12-13 DIAGNOSIS — O134 Gestational [pregnancy-induced] hypertension without significant proteinuria, complicating childbirth: Secondary | ICD-10-CM | POA: Diagnosis present

## 2021-12-13 DIAGNOSIS — O09299 Supervision of pregnancy with other poor reproductive or obstetric history, unspecified trimester: Secondary | ICD-10-CM

## 2021-12-13 DIAGNOSIS — O99820 Streptococcus B carrier state complicating pregnancy: Secondary | ICD-10-CM | POA: Diagnosis not present

## 2021-12-13 DIAGNOSIS — Z8759 Personal history of other complications of pregnancy, childbirth and the puerperium: Secondary | ICD-10-CM | POA: Diagnosis present

## 2021-12-13 DIAGNOSIS — O139 Gestational [pregnancy-induced] hypertension without significant proteinuria, unspecified trimester: Secondary | ICD-10-CM | POA: Diagnosis present

## 2021-12-13 DIAGNOSIS — J101 Influenza due to other identified influenza virus with other respiratory manifestations: Secondary | ICD-10-CM | POA: Diagnosis present

## 2021-12-13 DIAGNOSIS — O99824 Streptococcus B carrier state complicating childbirth: Secondary | ICD-10-CM | POA: Diagnosis present

## 2021-12-13 DIAGNOSIS — O35EXX0 Maternal care for other (suspected) fetal abnormality and damage, fetal genitourinary anomalies, not applicable or unspecified: Secondary | ICD-10-CM

## 2021-12-13 DIAGNOSIS — B951 Streptococcus, group B, as the cause of diseases classified elsewhere: Secondary | ICD-10-CM | POA: Diagnosis present

## 2021-12-13 LAB — CBC
HCT: 32.9 % — ABNORMAL LOW (ref 36.0–46.0)
Hemoglobin: 10.7 g/dL — ABNORMAL LOW (ref 12.0–15.0)
MCH: 26.9 pg (ref 26.0–34.0)
MCHC: 32.5 g/dL (ref 30.0–36.0)
MCV: 82.7 fL (ref 80.0–100.0)
Platelets: 259 10*3/uL (ref 150–400)
RBC: 3.98 MIL/uL (ref 3.87–5.11)
RDW: 14.4 % (ref 11.5–15.5)
WBC: 11.1 10*3/uL — ABNORMAL HIGH (ref 4.0–10.5)
nRBC: 0 % (ref 0.0–0.2)

## 2021-12-13 LAB — RESP PANEL BY RT-PCR (FLU A&B, COVID) ARPGX2
Influenza A by PCR: NEGATIVE
Influenza B by PCR: NEGATIVE
SARS Coronavirus 2 by RT PCR: NEGATIVE

## 2021-12-13 LAB — TYPE AND SCREEN
ABO/RH(D): O POS
Antibody Screen: NEGATIVE

## 2021-12-13 LAB — RPR: RPR Ser Ql: NONREACTIVE

## 2021-12-13 MED ORDER — ONDANSETRON HCL 4 MG/2ML IJ SOLN
4.0000 mg | Freq: Four times a day (QID) | INTRAMUSCULAR | Status: DC | PRN
  Administered 2021-12-13 – 2021-12-14 (×2): 4 mg via INTRAVENOUS
  Filled 2021-12-13 (×2): qty 2

## 2021-12-13 MED ORDER — ACETAMINOPHEN 325 MG PO TABS: 650.0000 mg | ORAL_TABLET | ORAL | Status: DC | PRN

## 2021-12-13 MED ORDER — MISOPROSTOL 50MCG HALF TABLET
50.0000 ug | ORAL_TABLET | ORAL | Status: DC
  Administered 2021-12-13 (×3): 50 ug via ORAL
  Filled 2021-12-13 (×2): qty 1

## 2021-12-13 MED ORDER — LIDOCAINE HCL (PF) 1 % IJ SOLN: 30.0000 mL | INTRAMUSCULAR | Status: DC | PRN

## 2021-12-13 MED ORDER — LACTATED RINGERS IV SOLN
INTRAVENOUS | Status: DC
  Administered 2021-12-13: 17:00:00 125 mL/h via INTRAVENOUS

## 2021-12-13 MED ORDER — OXYTOCIN BOLUS FROM INFUSION
333.0000 mL | Freq: Once | INTRAVENOUS | Status: AC
  Administered 2021-12-14: 23:00:00 333 mL via INTRAVENOUS

## 2021-12-13 MED ORDER — OXYTOCIN-SODIUM CHLORIDE 30-0.9 UT/500ML-% IV SOLN: 2.5000 [IU]/h | INTRAVENOUS | Status: DC

## 2021-12-13 MED ORDER — MISOPROSTOL 50MCG HALF TABLET
ORAL_TABLET | ORAL | Status: AC
  Filled 2021-12-13: qty 1

## 2021-12-13 MED ORDER — OXYCODONE-ACETAMINOPHEN 5-325 MG PO TABS: 2.0000 | ORAL_TABLET | ORAL | Status: DC | PRN

## 2021-12-13 MED ORDER — EPHEDRINE 5 MG/ML INJ: 10.0000 mg | INTRAVENOUS | Status: DC | PRN

## 2021-12-13 MED ORDER — PHENYLEPHRINE 40 MCG/ML (10ML) SYRINGE FOR IV PUSH (FOR BLOOD PRESSURE SUPPORT): 80.0000 ug | PREFILLED_SYRINGE | INTRAVENOUS | Status: DC | PRN

## 2021-12-13 MED ORDER — FENTANYL CITRATE (PF) 100 MCG/2ML IJ SOLN
INTRAMUSCULAR | Status: AC
  Filled 2021-12-13: qty 2

## 2021-12-13 MED ORDER — OXYCODONE-ACETAMINOPHEN 5-325 MG PO TABS: 1.0000 | ORAL_TABLET | ORAL | Status: DC | PRN

## 2021-12-13 MED ORDER — FENTANYL-BUPIVACAINE-NACL 0.5-0.125-0.9 MG/250ML-% EP SOLN
12.0000 mL/h | EPIDURAL | Status: DC | PRN
  Administered 2021-12-14: 05:00:00 12 mL/h via EPIDURAL
  Filled 2021-12-13 (×2): qty 250

## 2021-12-13 MED ORDER — SODIUM CHLORIDE 0.9 % IV SOLN
5.0000 10*6.[IU] | Freq: Once | INTRAVENOUS | Status: AC
  Administered 2021-12-13: 11:00:00 5 10*6.[IU] via INTRAVENOUS
  Filled 2021-12-13: qty 5

## 2021-12-13 MED ORDER — LACTATED RINGERS IV SOLN: 500.0000 mL | INTRAVENOUS | Status: DC | PRN

## 2021-12-13 MED ORDER — OXYTOCIN-SODIUM CHLORIDE 30-0.9 UT/500ML-% IV SOLN
1.0000 m[IU]/min | INTRAVENOUS | Status: DC
  Administered 2021-12-13: 2 m[IU]/min via INTRAVENOUS
  Filled 2021-12-13: qty 500

## 2021-12-13 MED ORDER — SOD CITRATE-CITRIC ACID 500-334 MG/5ML PO SOLN: 30.0000 mL | ORAL | Status: DC | PRN

## 2021-12-13 MED ORDER — PHENYLEPHRINE 40 MCG/ML (10ML) SYRINGE FOR IV PUSH (FOR BLOOD PRESSURE SUPPORT)
80.0000 ug | PREFILLED_SYRINGE | INTRAVENOUS | Status: DC | PRN
  Filled 2021-12-13: qty 10

## 2021-12-13 MED ORDER — LACTATED RINGERS IV SOLN: 500.0000 mL | Freq: Once | INTRAVENOUS | Status: DC

## 2021-12-13 MED ORDER — DIPHENHYDRAMINE HCL 50 MG/ML IJ SOLN: 12.5000 mg | INTRAMUSCULAR | Status: DC | PRN

## 2021-12-13 MED ORDER — PENICILLIN G POT IN DEXTROSE 60000 UNIT/ML IV SOLN
3.0000 10*6.[IU] | INTRAVENOUS | Status: DC
  Administered 2021-12-13 – 2021-12-14 (×8): 3 10*6.[IU] via INTRAVENOUS
  Filled 2021-12-13 (×8): qty 50

## 2021-12-13 MED ORDER — TERBUTALINE SULFATE 1 MG/ML IJ SOLN: 0.2500 mg | Freq: Once | INTRAMUSCULAR | Status: DC | PRN

## 2021-12-13 MED ORDER — FENTANYL CITRATE (PF) 100 MCG/2ML IJ SOLN
100.0000 ug | INTRAMUSCULAR | Status: DC | PRN
  Administered 2021-12-13 – 2021-12-14 (×6): 100 ug via INTRAVENOUS
  Filled 2021-12-13 (×6): qty 2

## 2021-12-13 NOTE — Progress Notes (Signed)
Olivia Taylor is a 25 y.o. G2P0100 at [redacted]w[redacted]d by 19 wks ultrasound admitted for induction of labor due to low normal fetal growth.  Subjective: Patient resting in LT lateral position. No complaints at this time. FOB and family member supportive at bedside.  Objective: BP 116/69    Pulse 78    Temp 98.1 F (36.7 C) (Oral)    Resp 16    Ht 5\' 1"  (1.549 m)    Wt 102.3 kg    LMP 02/10/2021    BMI 42.63 kg/m  No intake/output data recorded. No intake/output data recorded.  FHT:  FHR: 120 bpm, variability: moderate,  accelerations:  Present,  decelerations:  Absent UC:   occ. UC's SVE:   Dilation: 1 Effacement (%): 60 Station: -1 Exam by:: [email or web address], CNM Foley bulb still securely in place  Labs: Lab Results  Component Value Date   WBC 11.1 (H) 12/13/2021   HGB 10.7 (L) 12/13/2021   HCT 32.9 (L) 12/13/2021   MCV 82.7 12/13/2021   PLT 259 12/13/2021    Assessment / Plan: Induction of labor due to low normal fetal growth  Labor: Progressing normally S/P Cytotec x 2 doses Preeclampsia:   n/a Fetal Wellbeing:  Category I Pain Control:  IV pain meds I/D:   GBS Pos Anticipated MOD:  NSVD  12/15/2021, CNM 12/13/2021, 2:52 PM

## 2021-12-13 NOTE — H&P (Signed)
OBSTETRIC ADMISSION HISTORY AND PHYSICAL  Eli Rydalch is a 25 y.o. female G2P0100 with IUP at [redacted]w[redacted]d by 19 wks [place] presenting for IOL d/t low normal fetal growth.   Reports fetal movement. Denies vaginal bleeding.  She received her prenatal care at  [facility] .  Support person in labor: Brad - FOB  Ultrasounds Anatomy U/S: Normal >>F/U US on 11/21/21: Est. FW: 2416  gm 5 lb 5 oz 13  %  Prenatal History/Complications: Second Trimester Pregnancy loss (21 wks non-viable female d/t incompetent cervix) Pyelectasis of fetus on US GBS Positive  OB BOX: Nursing Staff Provider  Office Location  [facility]  Dating  By 19 week [place]  Language   ENGLISH Anatomy US  wnl with  UTD  Flu Vaccine  Declined 10/04/21 Genetic/Carrier Screen  NIPS:  Low risk female  AFP: Negative  Horizon: Negative  TDaP Vaccine   declined 10/04/21 Hgb A1C or  GTT Early  Third trimester negative 29 weeks  COVID Vaccine  NO   LAB RESULTS   Rhogam   Blood Type O/Positive/-- (06/28 1458)   Baby Feeding Plan  BREASTFEED Antibody Negative (06/28 1458)  Contraception  UNDECIDED Rubella 6.29 (06/28 1458) > IMMUNE  Circumcision  YES RPR Non Reactive (06/28 1458)   Pediatrician   UNSURE HBsAg Negative (06/28 1458)   Support Person  FOB HCVAb neg  Prenatal Classes  HIV Non Reactive (06/28 1458)     BTL Consent  GBS Positive (For PCN allergy, check sensitivities)   VBAC Consent  Pap        DME Rx [*] BP cuff [*] Weight Scale PHQ9 & GAD7 [*] new OB [x]  28 weeks  [  ] 36 weeks Induction  [ ]  Orders Entered [ ] Foley Y/N   Past Medical History: Past Medical History:  Diagnosis Date   Headache    Preterm labor     Past Surgical History: History reviewed. No pertinent surgical history.  Obstetrical History: OB History     Gravida  2   Para  1   Term      Preterm  1   AB  0   Living  0      SAB  0   IAB      Ectopic      Multiple      Live Births              Social History: Social History    Socioeconomic History   Marital status: Single    Spouse name: Not on file   Number of children: 0   Years of education: Not on file   Highest education level: Not on file  Occupational History   Not on file  Tobacco Use   Smoking status: Never   Smokeless tobacco: Never  Vaping Use   Vaping Use: Never used  Substance and Sexual Activity   Alcohol use: Not Currently   Drug use: Not Currently    Types: Marijuana    Comment: last used at 27 weeks   Sexual activity: Yes  Other Topics Concern   Not on file  Social History Narrative   Not on file   Social Determinants of Health   Financial Resource Strain: Not on file  Food Insecurity: Not on file  Transportation Needs: Not on file  Physical Activity: Not on file  Stress: Not on file  Social Connections: Not on file    Family History: Family History  Problem Relation Age of Onset  Hypertension Mother    Crohn's disease Mother    Hypertension Maternal Grandmother    Cancer Maternal Grandmother     Allergies: No Known Allergies  Medications Prior to Admission  Medication Sig Dispense Refill Last Dose   acetaminophen (TYLENOL) 500 MG tablet Take 500 mg by mouth every 6 (six) hours as needed for headache.   12/12/2021   aspirin EC 81 MG tablet Take 1 tablet (81 mg total) by mouth daily. Swallow whole. 30 tablet 5 12/12/2021   ondansetron (ZOFRAN ODT) 4 MG disintegrating tablet Take 1 tablet (4 mg total) by mouth every 8 (eight) hours as needed for nausea or vomiting. 20 tablet 3 12/13/2021   pantoprazole (PROTONIX) 40 MG tablet Take 1 tablet (40 mg total) by mouth daily. (Patient taking differently: Take 40 mg by mouth daily as needed (acid reflux).) 30 tablet 1 Past Month   Prenat-FeCbn-FeAspGl-FA-Omega (OB COMPLETE PETITE) 35-5-1-200 MG CAPS Take 1 capsule by mouth daily. 30 capsule 10 12/12/2021     Review of Systems  All systems reviewed and negative except as stated in HPI  Blood pressure 129/83, pulse 72,  height 5\' 1"  (1.549 m), weight 102.3 kg, last menstrual period 02/10/2021, unknown if currently breastfeeding. General appearance: alert, cooperative, no distress, and moderately obese Lungs: no respiratory distress Heart: regular rate  Abdomen: soft, non-tender; gravid b Pelvic: Dilation: 1 Effacement (%): 60 Cervical Position: Posterior Station: -1 Presentation: Vertex Exam by:: [email or web address], CNM  Foley Balloon inserted with sterile speculum .rdfole Extremities: Homans sign is negative, no sign of DVT Presentation: cephalic Fetal monitoring: 130 bpm / moderate variability / accels present / decels absent Uterine activity: none noted   Prenatal labs: ABO, Rh: --/--/O POS (12/27 0850) Antibody: NEG (12/27 0850) Rubella: 6.29 (06/28 1458) RPR: Non Reactive (10/18 0836)  HBsAg: Negative (06/28 1458)  HIV: Non Reactive (10/18 0836)  GBS: Positive/-- (12/06 1111)  Glucola: Normal 07-07-1987 Genetic screening:  Negative  Prenatal Transfer Tool  Maternal Diabetes: No Genetic Screening: Normal Maternal Ultrasounds/Referrals: Fetal Kidney Anomalies and Other: Low Normal fetal growth Fetal Ultrasounds or other Referrals:  None Maternal Substance Abuse:  No Significant Maternal Medications:  None Significant Maternal Lab Results: Group B Strep positive  Results for orders placed or performed during the hospital encounter of 12/13/21 (from the past 24 hour(s))  Type and screen   Collection Time: 12/13/21  8:50 AM  Result Value Ref Range   ABO/RH(D) O POS    Antibody Screen NEG    Sample Expiration      12/16/2021,2359 Performed at [facility], [address], [place], [place]   CBC   Collection Time: 12/13/21  8:53 AM  Result Value Ref Range   WBC 11.1 (H) 4.0 - 10.5 K/uL   RBC 3.98 3.87 - 5.11 MIL/uL   Hemoglobin 10.7 (L) 12.0 - 15.0 g/dL   HCT 12/15/21 (L) 01.0 - 93.2 %   MCV 82.7 80.0 - 100.0 fL   MCH 26.9 26.0 - 34.0 pg   MCHC 32.5 30.0 - 36.0 g/dL   RDW  35.5 73.2 - 20.2 %   Platelets 259 150 - 400 K/uL   nRBC 0.0 0.0 - 0.2 %  Resp Panel by RT-PCR (Flu A&B, Covid) Nasopharyngeal Swab   Collection Time: 12/13/21  9:05 AM   Specimen: Nasopharyngeal Swab; Nasopharyngeal(NP) swabs in vial transport medium  Result Value Ref Range   SARS Coronavirus 2 by RT PCR NEGATIVE NEGATIVE   Influenza A by PCR  NEGATIVE NEGATIVE   Influenza B by PCR NEGATIVE NEGATIVE    Patient Active Problem List   Diagnosis Date Noted   Pregnancy 12/13/2021   Positive GBS test 11/29/2021   Pyelectasis of fetus on prenatal ultrasound 07/28/2021   ASCUS of cervix with negative high risk HPV 06/14/2021   History of incompetent cervix, currently pregnant 06/14/2021   Supervision of high risk pregnancy, antepartum 06/13/2021    Assessment/Plan:  Khyleigh Mugrage is a 25 y.o. G2P0100 at [redacted]w[redacted]d here for IOL for low normal fetal growth  Labor: IOL -- pain control: planning IV pain meds and epidural  Fetal Wellbeing: EFW 5 lbs 5 oz by [place]. Cephalic by SVE.  -- GBS (Pos) -- continuous fetal monitoring - category 1   Postpartum Planning -- breast -- unsure on contraception method   Laury Deep, CNM  12/13/2021, 8:56 AM

## 2021-12-14 ENCOUNTER — Inpatient Hospital Stay (HOSPITAL_COMMUNITY): Payer: Medicaid Other

## 2021-12-14 ENCOUNTER — Inpatient Hospital Stay (HOSPITAL_COMMUNITY): Payer: Medicaid Other | Admitting: Anesthesiology

## 2021-12-14 ENCOUNTER — Inpatient Hospital Stay (HOSPITAL_COMMUNITY): Admission: AD | Admit: 2021-12-14 | Payer: Medicaid Other | Source: Home / Self Care | Admitting: Family Medicine

## 2021-12-14 DIAGNOSIS — O139 Gestational [pregnancy-induced] hypertension without significant proteinuria, unspecified trimester: Secondary | ICD-10-CM | POA: Diagnosis present

## 2021-12-14 DIAGNOSIS — Z8759 Personal history of other complications of pregnancy, childbirth and the puerperium: Secondary | ICD-10-CM | POA: Diagnosis present

## 2021-12-14 DIAGNOSIS — O99820 Streptococcus B carrier state complicating pregnancy: Secondary | ICD-10-CM

## 2021-12-14 DIAGNOSIS — Z3A39 39 weeks gestation of pregnancy: Secondary | ICD-10-CM

## 2021-12-14 DIAGNOSIS — O134 Gestational [pregnancy-induced] hypertension without significant proteinuria, complicating childbirth: Secondary | ICD-10-CM

## 2021-12-14 DIAGNOSIS — O9852 Other viral diseases complicating childbirth: Secondary | ICD-10-CM

## 2021-12-14 MED ORDER — LIDOCAINE HCL (PF) 1 % IJ SOLN
INTRAMUSCULAR | Status: DC | PRN
  Administered 2021-12-14: 3 mL via EPIDURAL
  Administered 2021-12-14: 5 mL via EPIDURAL
  Administered 2021-12-14: 2 mL via EPIDURAL

## 2021-12-14 MED ORDER — LACTATED RINGERS AMNIOINFUSION
INTRAVENOUS | Status: DC
  Administered 2021-12-14: 08:00:00 300 mL via INTRAUTERINE

## 2021-12-14 MED ORDER — LIDOCAINE HCL (PF) 1 % IJ SOLN
INTRAMUSCULAR | Status: AC
  Administered 2021-12-14: 23:00:00 30 mL
  Filled 2021-12-14: qty 30

## 2021-12-14 MED ORDER — BUPIVACAINE HCL (PF) 0.25 % IJ SOLN
INTRAMUSCULAR | Status: DC | PRN
  Administered 2021-12-14: 8 mL via EPIDURAL

## 2021-12-14 MED ORDER — LACTATED RINGERS IV SOLN
500.0000 mL | Freq: Once | INTRAVENOUS | Status: AC
  Administered 2021-12-14: 15:00:00 500 mL via INTRAVENOUS

## 2021-12-14 MED ORDER — FENTANYL CITRATE (PF) 100 MCG/2ML IJ SOLN
INTRAMUSCULAR | Status: DC | PRN
  Administered 2021-12-14 (×2): 100 ug via EPIDURAL

## 2021-12-14 NOTE — Progress Notes (Signed)
Olivia Taylor is a 25 y.o. G2P0100 at [redacted]w[redacted]d  admitted for induction of labor due to low normal growth at term.   Objective: BP (!) 94/58    Pulse (!) 132    Temp 98.4 F (36.9 C) (Oral)    Resp 16    Ht 5\' 1"  (1.549 m)    Wt 102.3 kg    LMP 02/10/2021    SpO2 99%    BMI 42.63 kg/m  No intake/output data recorded. No intake/output data recorded.  FHT:  FHR: 140 bpm, variability: moderate,  accelerations:  Present,  decelerations:  prev intermittent lates now improved with position change.  UC:   regular, every 3 minutes SVE:   Dilation: 6 Effacement (%): 80, 90 Station: -1 Exam by:: Dr [email or web address]  Labs: Lab Results  Component Value Date   WBC 11.1 (H) 12/13/2021   HGB 10.7 (L) 12/13/2021   HCT 32.9 (L) 12/13/2021   MCV 82.7 12/13/2021   PLT 259 12/13/2021    Assessment / Plan:  Labor:  cervix progressed to 6 cm at last check, pit was at 4 - had round of tachysystole and subsequent prolonged decel around 0900 and pit was turned off. Pit kept off and patient contracting on own. Contraction pattern and heart rate improved with now Cat I with excellent variability. Continue to leave pitocin off. Will reassess ~ 1300 or sooner if clinically indicated.  Fetal Wellbeing:  Category II, for intermittent late decelerations, moderate variability and now improved to cat I Pain Control:  Epidural I/D:   GBS pos   12/15/2021 12/14/2021, 11:12 AM

## 2021-12-14 NOTE — Progress Notes (Signed)
Olivia Taylor is a 25 y.o. G2P0100 at [redacted]w[redacted]d  admitted for IOL low normal fetal growth (13%ile)  Subjective: Feels more back pain and pressure  Objective: BP (!) 117/100    Pulse (!) 101    Temp 98.4 F (36.9 C) (Oral)    Resp 16    Ht 5\' 1"  (1.549 m)    Wt 102.3 kg    LMP 02/10/2021    SpO2 98%    BMI 42.63 kg/m  No intake/output data recorded. No intake/output data recorded.  FHT:  FHR: 130 bpm, variability: moderate,  accelerations:  Present,  decelerations:  Absent UC:   regular, every 2-5 minutes SVE:   Dilation: Lip/rim Effacement (%): 90, 100 Station: Plus 1, Plus 2 Exam by:: Dr. [email or web address]  Labs: Lab Results  Component Value Date   WBC 11.1 (H) 12/13/2021   HGB 10.7 (L) 12/13/2021   HCT 32.9 (L) 12/13/2021   MCV 82.7 12/13/2021   PLT 259 12/13/2021    Assessment / Plan: G2P0100 at [redacted]w[redacted]d  admitted for IOL low normal fetal growth  Labor:  now progressed to 9.5. Station also progressed to +1. However possibly asynclitic with caput. Bedside RN team working with patient to go through position changes to optimize fetal position. Fetal Wellbeing:  Category I Pain Control:  Epidural I/D:   GBSpos>PCN   [redacted]w[redacted]d 12/14/2021, 7:39 PM

## 2021-12-14 NOTE — Progress Notes (Signed)
Patient reporting increasing pain from UCs. Epidural bag changed when alerted by pump by RN. Fentanyl bag found to have approximately of medication in bag despite epidural pump reporting 4.41mL. Patient's dermatone level checked by RN with ice at bedside. Patient reports mild cold sensation bilaterally on abdomen, and on right lower extremity. Epidural site re-assessed and seemingly intact. MDA Dr Richardson Landry notified. Per Dr Richardson Landry, RN changed epidural tubing. Dr Richardson Landry will assess patient shortly.

## 2021-12-14 NOTE — Progress Notes (Signed)
S: Patient resting in bed overall comfortable with epidural, but feeling intermittent vaginal pressure. Significant other at bedside.   O: Vitals:   12/14/21 0516 12/14/21 0522 12/14/21 0525 12/14/21 0546  BP: 134/89 120/79 117/65 (!) 89/67  Pulse: 82 84 79 83  Resp:      Temp:      TempSrc:      SpO2:  100% 98%   Weight:      Height:        FHT:  FHR: 135 bpm, variability: moderate,  accelerations:  Present,  decelerations:  Absent UC:   regular, every 1-5 minutes SVE:   Dilation: 5 Effacement (%): 70 Station: -2 Exam by:: [employer]  A / P: 25 y.o. G2P0100 at [redacted]w[redacted]d IOOL for low normal fetal growth. Not much change from last check.   IV Pitocin titration for cervical dilation  GBS positive- PCN Fetal Wellbeing:  Category I Pain Control:  Epidural Anticipated MOD:  NSVD  Trellis Moment, SNM 12/14/2021, 6:28 AM

## 2021-12-14 NOTE — Discharge Summary (Signed)
Postpartum Discharge Summary  Date of Service updated     Patient Name: Olivia Taylor DOB: [birth year] MRN: [id number]  Date of admission: 12/13/2021 Delivery date:12/14/2021  Delivering provider: Patriciaann Clan  Date of discharge: 12/16/2021  Admitting diagnosis: Pregnancy [Z34.90] Intrauterine pregnancy: [redacted]w[redacted]d    Secondary diagnosis:  Principal Problem:   Pregnancy Active Problems:   History of incompetent cervix, currently pregnant   Pyelectasis of fetus on prenatal ultrasound   Positive GBS test   Gestational hypertension   Influenza A  Additional problems:     Discharge diagnosis: Term Pregnancy Delivered                                              Post partum procedures: None Augmentation: AROM, Pitocin, and Cytotec Complications: None  Hospital course: Induction of Labor With Vaginal Delivery   25y.o. yo G2P0100 at 365w2das admitted to the hospital 12/13/2021 for induction of labor.  Indication for induction:  low normal fetal growth .  Patient had an uncomplicated labor course as follows: Membrane Rupture Time/Date: 2:42 AM ,12/14/2021   Delivery Method:Vaginal, Spontaneous  Episiotomy: None  Lacerations:  2nd degree;Vaginal;Periurethral  Details of delivery can be found in separate delivery note.  Patient had a routine postpartum course. Procardia and lasix started for blood pressure control. She developed a mild cough postpartum, tested positive for influenza A. On day of discharge, she felt overall well and did not proceed with Tamiflu. Patient is discharged home 12/16/21.  Newborn Data: Birth date:12/14/2021  Birth time:10:24 PM  Gender:Female  Living status:Living  Apgars:8 ,9  Weight:2670 g   Magnesium Sulfate received: No BMZ received: No Rhophylac:No MMR:No T-DaP:Given prenatally Flu: Tested positive  Transfusion:No  Physical exam  Vitals:   12/15/21 1020 12/15/21 1838 12/15/21 2136 12/16/21 0555  BP: 127/85 123/78 126/73  125/83  Pulse: 85 100 (!) 107 85  Resp: 18 18 16 16   Temp: 97.9 F (36.6 C) 98.4 F (36.9 C) 97.7 F (36.5 C) 97.9 F (36.6 C)  TempSrc: Oral Oral Oral Oral  SpO2: 100% 99% 100% 100%  Weight:      Height:       General: alert, cooperative, and no distress Lochia: appropriate Uterine Fundus: firm Incision: N/A DVT Evaluation: No evidence of DVT seen on physical exam. Labs: Lab Results  Component Value Date   WBC 19.0 (H) 12/15/2021   HGB 10.1 (L) 12/15/2021   HCT 30.5 (L) 12/15/2021   MCV 80.9 12/15/2021   PLT 229 12/15/2021   CMP Latest Ref Rng & Units 12/15/2021  Glucose 70 - 99 mg/dL 92  BUN 6 - 20 mg/dL 5(L)  Creatinine 0.44 - 1.00 mg/dL 0.61  Sodium 135 - 145 mmol/L 135  Potassium 3.5 - 5.1 mmol/L 3.3(L)  Chloride 98 - 111 mmol/L 108  CO2 22 - 32 mmol/L 20(L)  Calcium 8.9 - 10.3 mg/dL 8.3(L)  Total Protein 6.5 - 8.1 g/dL 4.8(L)  Total Bilirubin 0.3 - 1.2 mg/dL 0.6  Alkaline Phos 38 - 126 U/L 153(H)  AST 15 - 41 U/L 31  ALT 0 - 44 U/L 15   Edinburgh Score: No flowsheet data found.   After visit meds:  Allergies as of 12/16/2021   No Known Allergies      Medication List     STOP taking these medications  aspirin EC 81 MG tablet   OB Complete Petite 35-5-1-200 MG Caps   ondansetron 4 MG disintegrating tablet Commonly known as: Zofran ODT   pantoprazole 40 MG tablet Commonly known as: Protonix       TAKE these medications    acetaminophen 500 MG tablet Commonly known as: TYLENOL Take 500 mg by mouth every 6 (six) hours as needed for headache.   furosemide 20 MG tablet Commonly known as: LASIX Take 1 tablet (20 mg total) by mouth daily for 4 days.   ibuprofen 600 MG tablet Commonly known as: ADVIL Take 1 tablet (600 mg total) by mouth every 6 (six) hours as needed.   NIFEdipine 30 MG 24 hr tablet Commonly known as: ADALAT CC Take 1 tablet (30 mg total) by mouth daily.         Discharge home in stable condition Infant  Feeding: Breast Infant Disposition: Hopefully home with mother Discharge instruction: per After Visit Summary and Postpartum booklet. Activity: Advance as tolerated. Pelvic rest for 6 weeks.  Diet: routine diet Future Appointments: Future Appointments  Date Time Provider [place]  12/23/2021 10:40 AM [place] None  01/18/2022 10:55 AM Gavin Pound, CNM [facility] None   Follow up Visit:  Message sent to [facility] by Dr Higinio Plan:  Please schedule this patient for a In person postpartum visit in 6 weeks with the following provider: Any provider. Additional Postpartum F/U:BP check 1 week  High risk pregnancy complicated by: intrapartum gHTN  Delivery mode:  Vaginal, Spontaneous  Anticipated Birth Control:  Unsure (considering IUD)    12/16/2021 Patriciaann Clan, DO

## 2021-12-14 NOTE — Lactation Note (Signed)
This note was copied from a baby's chart. Lactation Consultation Note  Patient Name: Olivia Taylor [id number] Date: 12/14/2021 Reason for consult: L&D Initial assessment;1st time breastfeeding;Term Age:26 hours LC entered the room, mom was doing skin to skin with infant. Mom did breast stimulation and latched infant on her left breast using the football hold position, infant latched with depth, sustaining latch and breastfeed for 7 minutes. Afterwards mom was doing skin to skin with infant. Mom knows to breastfeed infant according to primal feeding cues, skin to skin. See latch score- mom could benefit from having hand pump to pre-pump breast prior to latching infant.  Mom knows to ask RN/LC for further latch assistance on MBU if needed. LC congratulated parents on the birth of their daughter. Maternal Data    Feeding Mother's Current Feeding Choice: Breast Milk and Formula  LATCH Score Latch: Grasps breast easily, tongue down, lips flanged, rhythmical sucking.  Audible Swallowing: Spontaneous and intermittent  Type of Nipple: Flat  Comfort (Breast/Nipple): Soft / non-tender  Hold (Positioning): Assistance needed to correctly position infant at breast and maintain latch.  LATCH Score: 8   Lactation Tools Discussed/Used    Interventions Interventions: Skin to skin;Assisted with latch;Breast compression;Adjust position;Support pillows;Position options;Education  Discharge    Consult Status Consult Status: Follow-up from L&D Date: 12/15/21 Follow-up type: In-patient    [name] 12/14/2021, 11:53 PM

## 2021-12-14 NOTE — Anesthesia Preprocedure Evaluation (Signed)
Anesthesia Evaluation  Patient identified by MRN, date of birth, ID band Patient awake    Reviewed: Allergy & Precautions, NPO status , Patient's Chart, lab work & pertinent test results  Airway Mallampati: II  TM Distance: >3 FB Neck ROM: Full    Dental  (+) Teeth Intact, Dental Advisory Given   Pulmonary neg pulmonary ROS,    Pulmonary exam normal breath sounds clear to auscultation       Cardiovascular negative cardio ROS Normal cardiovascular exam Rhythm:Regular Rate:Normal     Neuro/Psych negative neurological ROS     GI/Hepatic Neg liver ROS, GERD  Medicated,  Endo/Other  Morbid obesity  Renal/GU negative Renal ROS     Musculoskeletal negative musculoskeletal ROS (+)   Abdominal   Peds  Hematology  (+) Blood dyscrasia, anemia , Plt 259k   Anesthesia Other Findings Day of surgery medications reviewed with the patient.  Reproductive/Obstetrics (+) Pregnancy                             Anesthesia Physical Anesthesia Plan  ASA: 3  Anesthesia Plan: Epidural   Post-op Pain Management:    Induction:   PONV Risk Score and Plan: 2 and Treatment may vary due to age or medical condition  Airway Management Planned: Natural Airway  Additional Equipment:   Intra-op Plan:   Post-operative Plan:   Informed Consent: I have reviewed the patients History and Physical, chart, labs and discussed the procedure including the risks, benefits and alternatives for the proposed anesthesia with the patient or authorized representative who has indicated his/her understanding and acceptance.     Dental advisory given  Plan Discussed with:   Anesthesia Plan Comments: (Patient identified. Risks/Benefits/Options discussed with patient including but not limited to bleeding, infection, nerve damage, paralysis, failed block, incomplete pain control, headache, blood pressure changes, nausea,  vomiting, reactions to medication both or allergic, itching and postpartum back pain. Confirmed with bedside nurse the patient's most recent platelet count. Confirmed with patient that they are not currently taking any anticoagulation, have any bleeding history or any family history of bleeding disorders. Patient expressed understanding and wished to proceed. All questions were answered. )        Anesthesia Quick Evaluation

## 2021-12-14 NOTE — Anesthesia Procedure Notes (Signed)
Epidural Patient location during procedure: OB Start time: 12/14/2021 5:05 AM End time: 12/14/2021 5:15 AM  Staffing Anesthesiologist: Cecile Hearing, MD Performed: anesthesiologist   Preanesthetic Checklist Completed: patient identified, IV checked, risks and benefits discussed, monitors and equipment checked, pre-op evaluation and timeout performed  Epidural Patient position: sitting Prep: DuraPrep Patient monitoring: blood pressure and continuous pulse ox Approach: midline Location: L3-L4 Injection technique: LOR air  Needle:  Needle type: Tuohy  Needle gauge: 17 G Needle length: 9 cm Needle insertion depth: 8 cm Catheter size: 19 Gauge Catheter at skin depth: 14 cm Test dose: negative and Other (1% Lidocaine)  Additional Notes Patient identified.  Risk benefits discussed including failed block, incomplete pain control, headache, nerve damage, paralysis, blood pressure changes, nausea, vomiting, reactions to medication both toxic or allergic, and postpartum back pain.  Patient expressed understanding and wished to proceed.  All questions were answered.  Sterile technique used throughout procedure and epidural site dressed with sterile barrier dressing. No paresthesia or other complications noted. The patient did not experience any signs of intravascular injection such as tinnitus or metallic taste in mouth nor signs of intrathecal spread such as rapid motor block. Please see nursing notes for vital signs. Reason for block:procedure for pain

## 2021-12-14 NOTE — Progress Notes (Signed)
S: Patient reports feeling contractions but coping well with pain. Significant other at bedside and supportive. Discuss AROM and agrees. Anticipating the arrival of baby girl Jordan.   O: Vitals:   12/14/21 0200 12/14/21 0201 12/14/21 0220 12/14/21 0231  BP: 103/76 103/76  123/75  Pulse: 85 85  86  Resp: 16   16  Temp:   98.4 F (36.9 C)   TempSrc:   Oral   SpO2:      Weight:      Height:        FHT:  FHR: 145 bpm, variability: moderate,  accelerations:  Present,  decelerations:  Present variables UC:   irregular, every 1-5 minutes SVE:   Dilation: 4.5 Effacement (%): 60 Station: -3 Exam by:: Raelyn Number, student midwife  A / P: 25 y.o. G2P0100 at [redacted]w[redacted]d IOOL for low normal fetal growth. Cervix unchanged from last check.   AROM/IUPC for pitocin titration Fetal Wellbeing:  Category II Pain Control:  IV pain meds Anticipated MOD:  NSVD  Trellis Moment, SNM 12/14/2021, 2:50 AM

## 2021-12-15 ENCOUNTER — Encounter (HOSPITAL_COMMUNITY): Payer: Self-pay | Admitting: Obstetrics and Gynecology

## 2021-12-15 ENCOUNTER — Ambulatory Visit: Payer: Medicaid Other

## 2021-12-15 LAB — CBC WITH DIFFERENTIAL/PLATELET
Abs Immature Granulocytes: 0.14 10*3/uL — ABNORMAL HIGH (ref 0.00–0.07)
Basophils Absolute: 0 10*3/uL (ref 0.0–0.1)
Basophils Relative: 0 %
Eosinophils Absolute: 0 10*3/uL (ref 0.0–0.5)
Eosinophils Relative: 0 %
HCT: 30.5 % — ABNORMAL LOW (ref 36.0–46.0)
Hemoglobin: 10.1 g/dL — ABNORMAL LOW (ref 12.0–15.0)
Immature Granulocytes: 1 %
Lymphocytes Relative: 3 %
Lymphs Abs: 0.5 10*3/uL — ABNORMAL LOW (ref 0.7–4.0)
MCH: 26.8 pg (ref 26.0–34.0)
MCHC: 33.1 g/dL (ref 30.0–36.0)
MCV: 80.9 fL (ref 80.0–100.0)
Monocytes Absolute: 0.9 10*3/uL (ref 0.1–1.0)
Monocytes Relative: 5 %
Neutro Abs: 17.3 10*3/uL — ABNORMAL HIGH (ref 1.7–7.7)
Neutrophils Relative %: 91 %
Platelets: 229 10*3/uL (ref 150–400)
RBC: 3.77 MIL/uL — ABNORMAL LOW (ref 3.87–5.11)
RDW: 14.4 % (ref 11.5–15.5)
WBC: 19 10*3/uL — ABNORMAL HIGH (ref 4.0–10.5)
nRBC: 0 % (ref 0.0–0.2)

## 2021-12-15 LAB — COMPREHENSIVE METABOLIC PANEL
ALT: 15 U/L (ref 0–44)
AST: 31 U/L (ref 15–41)
Albumin: 2.2 g/dL — ABNORMAL LOW (ref 3.5–5.0)
Alkaline Phosphatase: 153 U/L — ABNORMAL HIGH (ref 38–126)
Anion gap: 7 (ref 5–15)
BUN: 5 mg/dL — ABNORMAL LOW (ref 6–20)
CO2: 20 mmol/L — ABNORMAL LOW (ref 22–32)
Calcium: 8.3 mg/dL — ABNORMAL LOW (ref 8.9–10.3)
Chloride: 108 mmol/L (ref 98–111)
Creatinine, Ser: 0.61 mg/dL (ref 0.44–1.00)
GFR, Estimated: >60 mL/min (ref >60)
Glucose, Bld: 92 mg/dL (ref 70–99)
Potassium: 3.3 mmol/L — ABNORMAL LOW (ref 3.5–5.1)
Sodium: 135 mmol/L (ref 135–145)
Total Bilirubin: 0.6 mg/dL (ref 0.3–1.2)
Total Protein: 4.8 g/dL — ABNORMAL LOW (ref 6.5–8.1)

## 2021-12-15 LAB — RESP PANEL BY RT-PCR (FLU A&B, COVID) ARPGX2
Influenza A by PCR: POSITIVE — AB
Influenza B by PCR: NEGATIVE
SARS Coronavirus 2 by RT PCR: NEGATIVE

## 2021-12-15 MED ORDER — IBUPROFEN 600 MG PO TABS
600.0000 mg | ORAL_TABLET | Freq: Four times a day (QID) | ORAL | Status: DC
  Administered 2021-12-15 – 2021-12-16 (×6): 600 mg via ORAL
  Filled 2021-12-15 (×6): qty 1

## 2021-12-15 MED ORDER — TETANUS-DIPHTH-ACELL PERTUSSIS 5-2.5-18.5 LF-MCG/0.5 IM SUSY: 0.5000 mL | PREFILLED_SYRINGE | Freq: Once | INTRAMUSCULAR | Status: DC

## 2021-12-15 MED ORDER — BENZOCAINE-MENTHOL 20-0.5 % EX AERO
1.0000 "application " | INHALATION_SPRAY | CUTANEOUS | Status: DC | PRN
  Filled 2021-12-15: qty 56

## 2021-12-15 MED ORDER — PRENATAL MULTIVITAMIN CH
1.0000 | ORAL_TABLET | Freq: Every day | ORAL | Status: DC
  Administered 2021-12-15 – 2021-12-16 (×2): 1 via ORAL
  Filled 2021-12-15 (×2): qty 1

## 2021-12-15 MED ORDER — SODIUM CHLORIDE 0.9% FLUSH: 3.0000 mL | Freq: Two times a day (BID) | INTRAVENOUS | Status: DC

## 2021-12-15 MED ORDER — ACETAMINOPHEN 325 MG PO TABS
650.0000 mg | ORAL_TABLET | ORAL | Status: DC | PRN
  Administered 2021-12-16: 04:00:00 650 mg via ORAL
  Filled 2021-12-15: qty 2

## 2021-12-15 MED ORDER — SODIUM CHLORIDE 0.9% FLUSH: 3.0000 mL | INTRAVENOUS | Status: DC | PRN

## 2021-12-15 MED ORDER — NIFEDIPINE ER OSMOTIC RELEASE 30 MG PO TB24
30.0000 mg | ORAL_TABLET | Freq: Every day | ORAL | Status: DC
  Administered 2021-12-15 – 2021-12-16 (×2): 30 mg via ORAL
  Filled 2021-12-15 (×2): qty 1

## 2021-12-15 MED ORDER — DIBUCAINE (PERIANAL) 1 % EX OINT: 1.0000 "application " | TOPICAL_OINTMENT | CUTANEOUS | Status: DC | PRN

## 2021-12-15 MED ORDER — COCONUT OIL OIL
1.0000 "application " | TOPICAL_OIL | Status: DC | PRN
  Administered 2021-12-16: 1 via TOPICAL

## 2021-12-15 MED ORDER — MEDROXYPROGESTERONE ACETATE 150 MG/ML IM SUSP: 150.0000 mg | INTRAMUSCULAR | Status: DC | PRN

## 2021-12-15 MED ORDER — SIMETHICONE 80 MG PO CHEW: 80.0000 mg | CHEWABLE_TABLET | ORAL | Status: DC | PRN

## 2021-12-15 MED ORDER — ZOLPIDEM TARTRATE 5 MG PO TABS: 5.0000 mg | ORAL_TABLET | Freq: Every evening | ORAL | Status: DC | PRN

## 2021-12-15 MED ORDER — SODIUM CHLORIDE 0.9 % IV SOLN: 250.0000 mL | INTRAVENOUS | Status: DC | PRN

## 2021-12-15 MED ORDER — DIPHENHYDRAMINE HCL 25 MG PO CAPS: 25.0000 mg | ORAL_CAPSULE | Freq: Four times a day (QID) | ORAL | Status: DC | PRN

## 2021-12-15 MED ORDER — ONDANSETRON HCL 4 MG PO TABS: 4.0000 mg | ORAL_TABLET | ORAL | Status: DC | PRN

## 2021-12-15 MED ORDER — ONDANSETRON HCL 4 MG/2ML IJ SOLN: 4.0000 mg | INTRAMUSCULAR | Status: DC | PRN

## 2021-12-15 MED ORDER — MEASLES, MUMPS & RUBELLA VAC IJ SOLR: 0.5000 mL | Freq: Once | INTRAMUSCULAR | Status: DC

## 2021-12-15 MED ORDER — MENTHOL 3 MG MT LOZG: 1.0000 | LOZENGE | OROMUCOSAL | Status: DC | PRN

## 2021-12-15 MED ORDER — SENNOSIDES-DOCUSATE SODIUM 8.6-50 MG PO TABS
2.0000 | ORAL_TABLET | ORAL | Status: DC
  Administered 2021-12-15 – 2021-12-16 (×2): 2 via ORAL
  Filled 2021-12-15 (×2): qty 2

## 2021-12-15 MED ORDER — WITCH HAZEL-GLYCERIN EX PADS
1.0000 "application " | MEDICATED_PAD | CUTANEOUS | Status: DC | PRN
  Administered 2021-12-16: 1 via TOPICAL

## 2021-12-15 NOTE — Plan of Care (Signed)
Continued assisting pt with latch this shift. Baby latching well, but mother requires assistance and encouragement with latch. Encouraged pt to call for assistance w latching baby if needed. Pt pumping after feeding and back-feeding colostrum via spoon to baby. Continue to reinforce and encourage pt. Requested LC to visit pt tonight and tomorrow as well, as planning for D/c

## 2021-12-15 NOTE — Lactation Note (Signed)
This note was copied from a baby's chart. Lactation Consultation Note  Patient Name: Olivia Taylor [id number] Date: 12/15/2021 Reason for consult: Initial assessment;1st time breastfeeding;Primapara;Term Age:26 hours   Initial Lactation Consult:  Visited with mother after viewing baby's low blood sugar of 34 mg/dl which resulted at 5397.  Previous blood sugar was 61 mg/dl at 6734.  Baby received glucose gel from RN.  Reviewed basic breast feeding concepts with mother; discussed the lower blood sugar reading and how the RN would keep her informed of the most recent lab draw when the results are in.  Depending upon the next reading, mother may be interested in beginning formula supplementation.  Baby weighs < 6 pounds.  Educated mother on the importance of doing STS to help stabilize blood sugars.  Mother verbalized understanding.  At this time, baby is swaddled and asleep in the bassinet.  Support person present and asleep on the couch.   Maternal Data Has patient been taught Hand Expression?: Yes Does the patient have breastfeeding experience prior to this delivery?: No  Feeding Mother's Current Feeding Choice: Breast Milk and Formula  LATCH Score                    Lactation Tools Discussed/Used    Interventions    Discharge Pump: Personal  Consult Status Consult Status: Follow-up Date: 12/16/21 Follow-up type: In-patient    Favor Kreh R Avleen Bordwell 12/15/2021, 7:23 AM

## 2021-12-15 NOTE — Progress Notes (Signed)
POSTPARTUM PROGRESS NOTE  Post Partum Day 1 Subjective:  Olivia Taylor is a 25 y.o. G8Q7619 [redacted]w[redacted]d s/p induced vaginal delivery.  No acute events overnight.  Pt denies problems with ambulating, voiding or po intake.  She denies nausea or vomiting.  Pain is well controlled.  She has had flatus. She has not had bowel movement.  Lochia Small.   Objective: Blood pressure 135/87, pulse (!) 102, temperature 98.5 F (36.9 C), temperature source Oral, resp. rate 19, height 5\' 1"  (1.549 m), weight 102.3 kg, last menstrual period 02/10/2021, SpO2 100 %, unknown if currently breastfeeding.  Physical Exam:  General: alert, cooperative and no distress Lochia:normal flow Chest: CTAB Heart: RRR no m/r/g Abdomen: +BS, soft, nontender,  Uterine Fundus: firm, non-tender DVT Evaluation: No calf swelling or tenderness Extremities: no edema  Recent Labs    12/13/21 0853 12/15/21 0038  HGB 10.7* 10.1*  HCT 32.9* 30.5*    Assessment/Plan:  ASSESSMENT: Olivia Taylor is a 25 y.o. G2P1101 [redacted]w[redacted]d s/p induced vaginal delivery, doing well. For her ghtn she is asymptomatic. Most recent bp wnl on procardia, will continue that med. Did not get cmp checked with dx of ghtn so ordering that right now. Has new mild cough and sore throat, will repeat covid/flu naat. Gave LARC discussion, patient interested in IUD, will plan on that at 6 wk f/u. Plan for d/c tomorrow.    LOS: 2 days   [redacted]w[redacted]d 12/15/2021, 9:39 AM

## 2021-12-15 NOTE — Anesthesia Postprocedure Evaluation (Signed)
Anesthesia Post Note  Patient: Olivia Taylor  Procedure(s) Performed: AN AD HOC LABOR EPIDURAL     Patient location during evaluation: Mother Baby Anesthesia Type: Epidural Level of consciousness: awake Pain management: satisfactory to patient Vital Signs Assessment: post-procedure vital signs reviewed and stable Respiratory status: spontaneous breathing Cardiovascular status: stable Anesthetic complications: no   No notable events documented.  Last Vitals:  Vitals:   12/15/21 0230 12/15/21 0630  BP: 135/88 135/87  Pulse: 100 (!) 102  Resp: 18 19  Temp:  36.9 C  SpO2:  100%    Last Pain:  Vitals:   12/15/21 0720  TempSrc:   PainSc: 0-No pain   Pain Goal: Patients Stated Pain Goal: 0 (12/14/21 1442)                 Cephus Shelling

## 2021-12-16 ENCOUNTER — Other Ambulatory Visit (HOSPITAL_COMMUNITY): Payer: Self-pay

## 2021-12-16 DIAGNOSIS — J101 Influenza due to other identified influenza virus with other respiratory manifestations: Secondary | ICD-10-CM

## 2021-12-16 MED ORDER — IBUPROFEN 600 MG PO TABS
600.0000 mg | ORAL_TABLET | Freq: Four times a day (QID) | ORAL | 0 refills | Status: DC | PRN
  Filled 2021-12-16: qty 60, 15d supply, fill #0

## 2021-12-16 MED ORDER — NIFEDIPINE ER 30 MG PO TB24
30.0000 mg | ORAL_TABLET | Freq: Every day | ORAL | 0 refills | Status: DC
  Filled 2021-12-16: qty 30, 30d supply, fill #0

## 2021-12-16 MED ORDER — FUROSEMIDE 20 MG PO TABS
20.0000 mg | ORAL_TABLET | Freq: Every day | ORAL | 0 refills | Status: DC
  Filled 2021-12-16: qty 4, 4d supply, fill #0

## 2021-12-16 MED ORDER — FUROSEMIDE 20 MG PO TABS
20.0000 mg | ORAL_TABLET | Freq: Every day | ORAL | Status: DC
  Administered 2021-12-16: 10:00:00 20 mg via ORAL
  Filled 2021-12-16: qty 1

## 2021-12-16 MED ORDER — BLOOD PRESSURE CUFF MISC: 1.0000 | Freq: Every day | 0 refills | Status: DC

## 2021-12-16 NOTE — Lactation Note (Signed)
This note was copied from a baby's chart. Lactation Consultation Note  Patient Name: Girl Allex Madia [id number] Date: 12/16/2021 Reason for consult: Mother's request Age:26 hours Mom's current feeding plan is breast and bottle feeding infant. Mom requested LC services, when [facility] entered the room, per mom, infant had finished breastfeeding and mom was happy because she did not have to call  RN for latch assistance. Per mom, infant breastfeed for 10 minutes. LC praised mom for her BF efforts, and mom knows she can breastfeed infant on both breast during a feeding. LC mention infant may start cluster feeding after 24 hours of life and that this is normal infant feeding behavior. LC encourage mom to latch infant at the breast first  to help establish her milk supply and afterwards  supplement  infant with formula  Mom will continue to breastfeed infant according to cues, 8 to 12+ times within 24 hours, skin to skin.  Maternal Data    Feeding Mother's Current Feeding Choice: Breast Milk and Formula  LATCH Score                    Lactation Tools Discussed/Used    Interventions    Discharge    Consult Status Consult Status: Follow-up Date: 12/16/21 Follow-up type: In-patient    [name] 12/16/2021, 1:51 AM

## 2021-12-16 NOTE — Progress Notes (Signed)
Hospital is currently out of Blood Pressure cuffs for [employer]. RN instructed patient to call doctor's office to get cuff and set up Baby Scripts through the office. Patient verbalized understanding.

## 2021-12-16 NOTE — Addendum Note (Signed)
Addendum  created 12/16/21 1407 by Trevor Iha, MD   Intraprocedure Event edited, Intraprocedure Staff edited

## 2021-12-16 NOTE — Addendum Note (Signed)
Addendum  created 12/16/21 1552 by Bethena Midget, MD   Intraprocedure Staff edited

## 2021-12-16 NOTE — Lactation Note (Signed)
This note was copied from a baby's chart. Lactation Consultation Note  Patient Name: Olivia Taylor [id number] Date: 12/16/2021 Reason for consult: Follow-up assessment;Primapara;1st time breastfeeding;Term;Infant < 6lbs Age:26 hours  LC in to visit with P1 (21 wk fetal demise) Mom of term baby. No weight loss currently with adequate output.  Baby is both breast and formula feeding by bottle per Mom's choice.    Mom encouraged to keep baby STS as much as possible, offering the breast before supplementing baby.    Mom has a DEBP for home use.  Talked about pumping both breasts for 15-30 mins whenever baby is supplemented to support a full milk supply.  Mom is very interested in this.    Mom currently supplementing baby with formula, demonstrated paced bottle to align more with milk flow from breast.  Mom appreciative.    Engorgement prevention and treatment reviewed.   Offered to message OP lactation clinic for F/U after discharge and Mom very interested.  Message sent.  Mom shown numbers to call for concerns.  Mom aware of OP lactation support available to her.     Lactation Tools Discussed/Used Tools: Bottle  Interventions Interventions: Breast feeding basics reviewed;Skin to skin;Breast massage;Hand express;Pace feeding;Education;DEBP  Discharge Discharge Education: Engorgement and breast care;Warning signs for feeding baby;Outpatient recommendation;Outpatient Epic message sent Pump: Personal;DEBP ([facility] DEBP)  Consult Status Consult Status: Complete Date: 12/16/21 Follow-up type: Call as needed    [name] 12/16/2021, 1:49 PM

## 2021-12-23 ENCOUNTER — Ambulatory Visit: Payer: Medicaid Other

## 2021-12-27 ENCOUNTER — Telehealth (HOSPITAL_COMMUNITY): Payer: Self-pay | Admitting: *Deleted

## 2021-12-27 NOTE — Telephone Encounter (Signed)
Attempted hospital discharge follow-up call. Voicemail full. Deforest Hoyles, RN, 12/27/21, [phone]

## 2021-12-29 ENCOUNTER — Telehealth: Payer: Self-pay | Admitting: *Deleted

## 2021-12-29 ENCOUNTER — Telehealth: Payer: Medicaid Other

## 2021-12-29 NOTE — Telephone Encounter (Signed)
Patient unable to come in to office for PP BP check. Changed appt to virtual. Patient was not at home with BP cuff at 1044 AM. Patient agreed to call RN back to complete virtual visit and report BP once at home.

## 2022-01-18 ENCOUNTER — Ambulatory Visit: Payer: Medicaid Other

## 2022-11-26 IMAGING — US US MFM OB TRANSVAGINAL
1 series · 14 of 28 positions shown · non-contrast
Comparison: none

[Series 1: us mfm ob transvaginal · 14 of 69 slices shown]
[im 3/69]
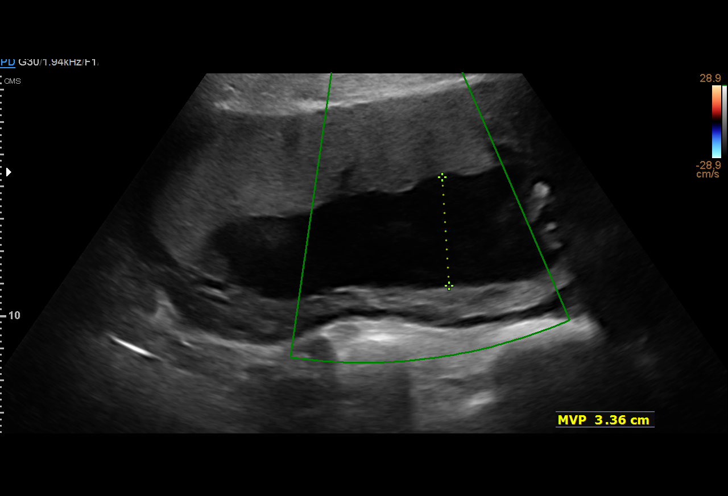
[im 8/69]
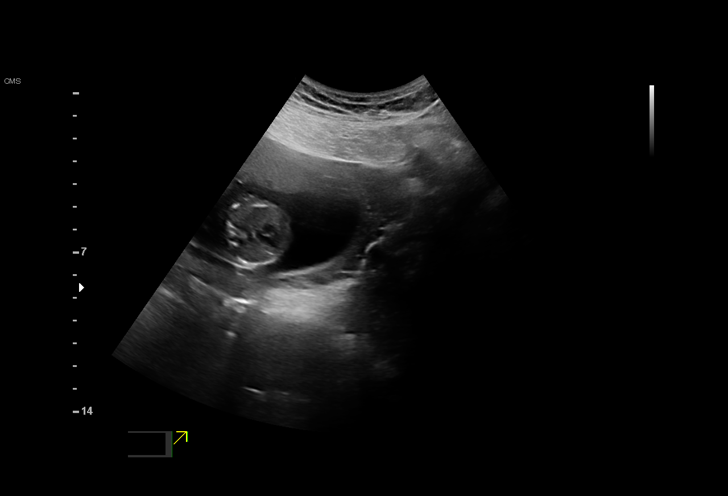
[im 13/69]
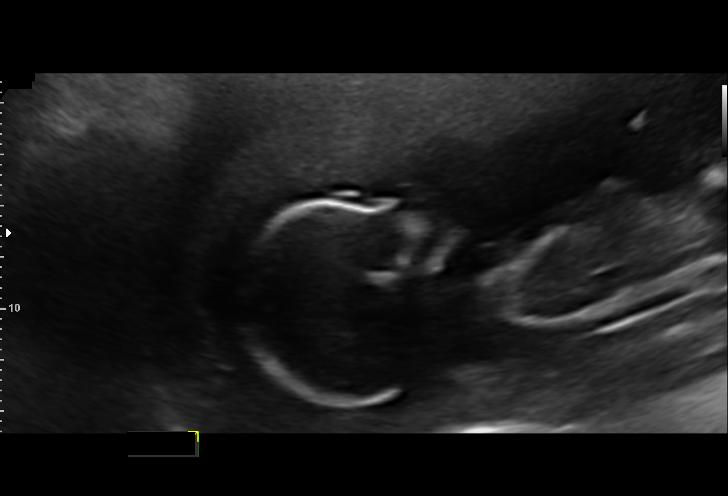
[im 18/69]
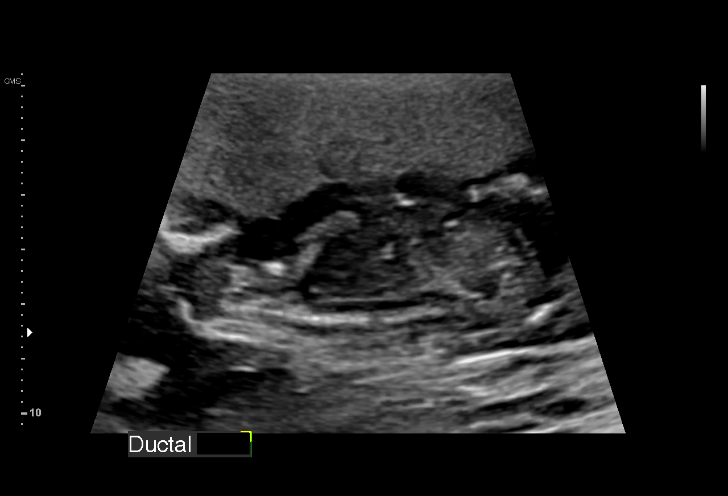
[im 23/69]
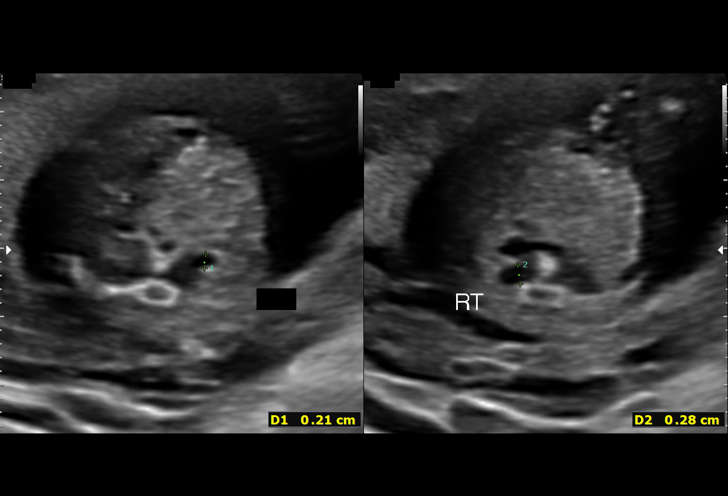
[im 28/69]
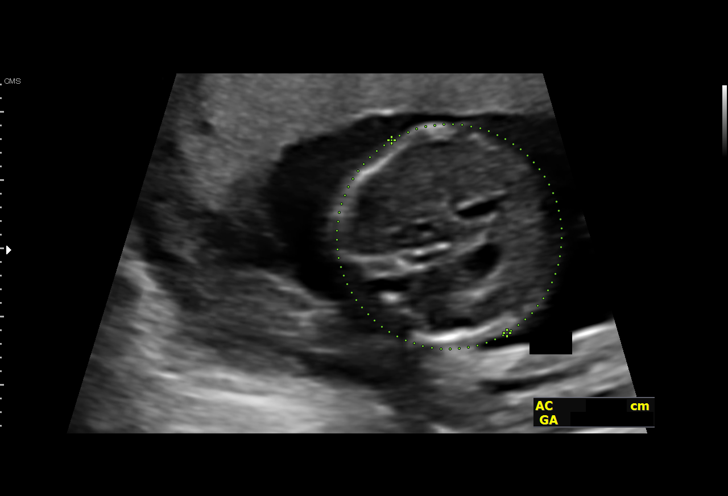
[im 33/69]
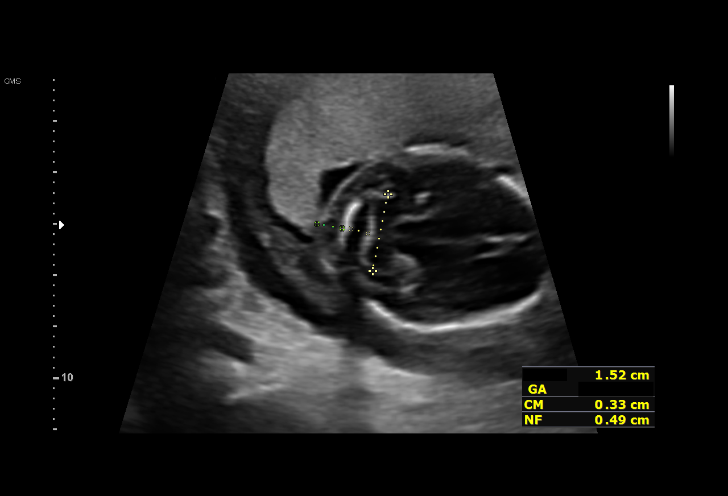
[im 38/69]
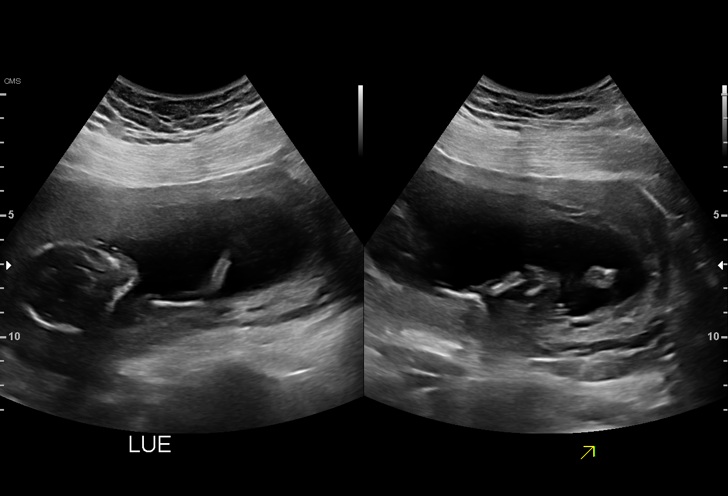
[im 43/69]
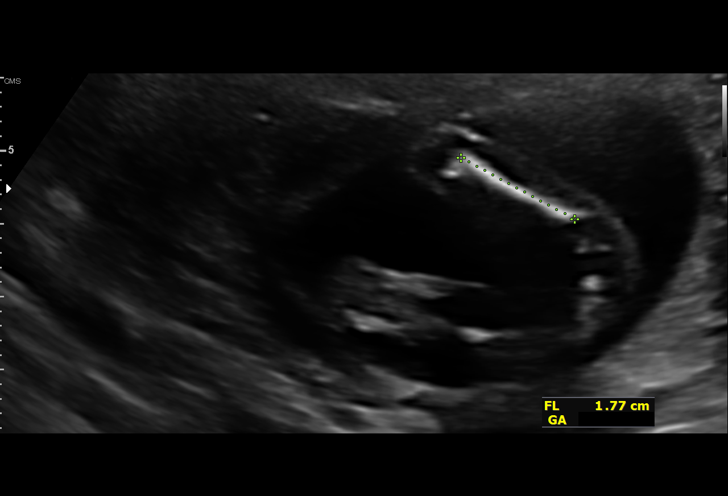
[im 48/69]
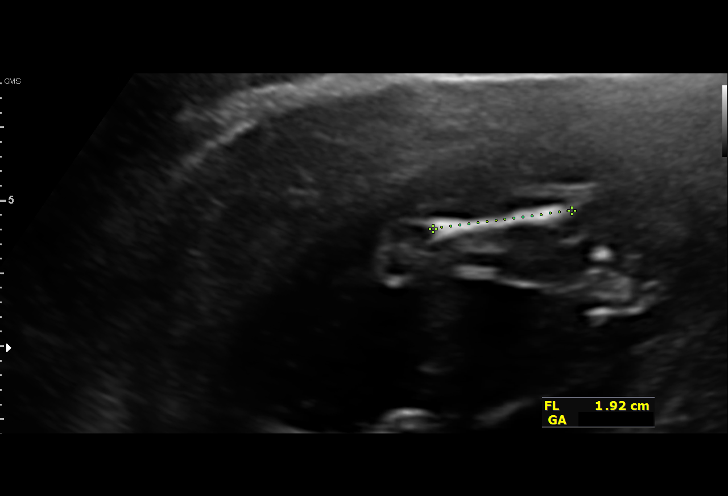
[im 53/69]
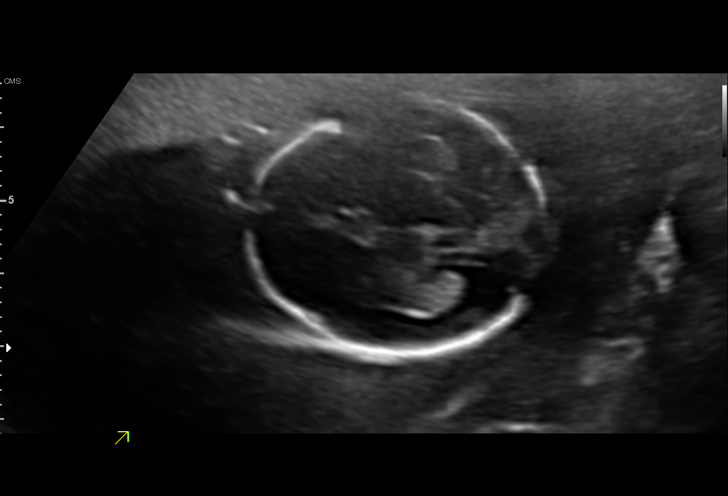
[im 58/69]
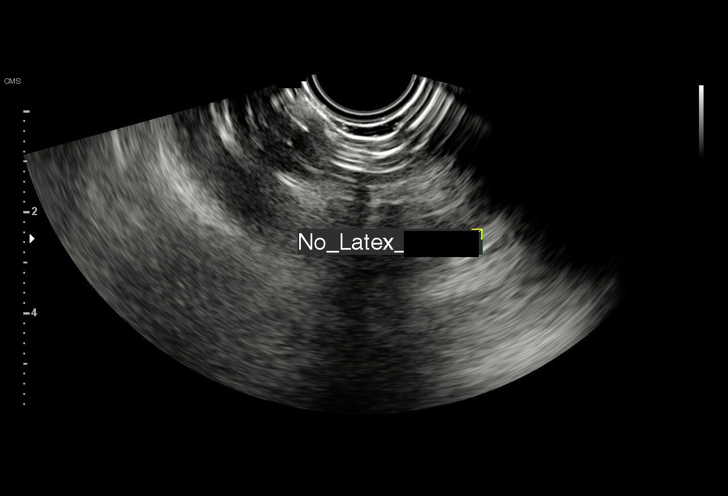
[im 63/69]
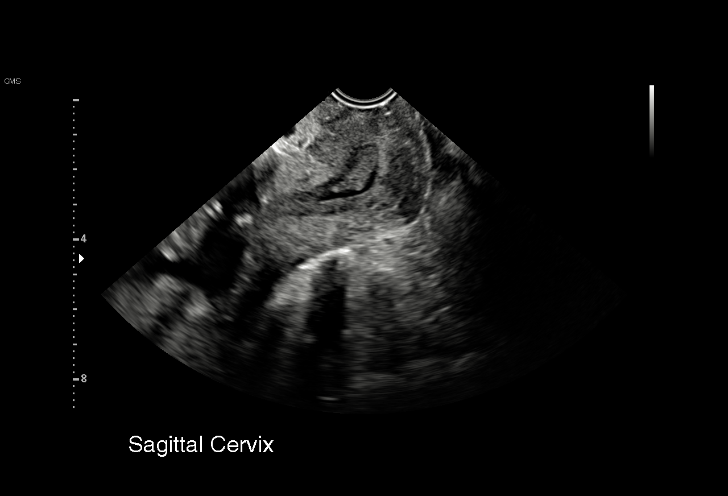
[im 69/69]
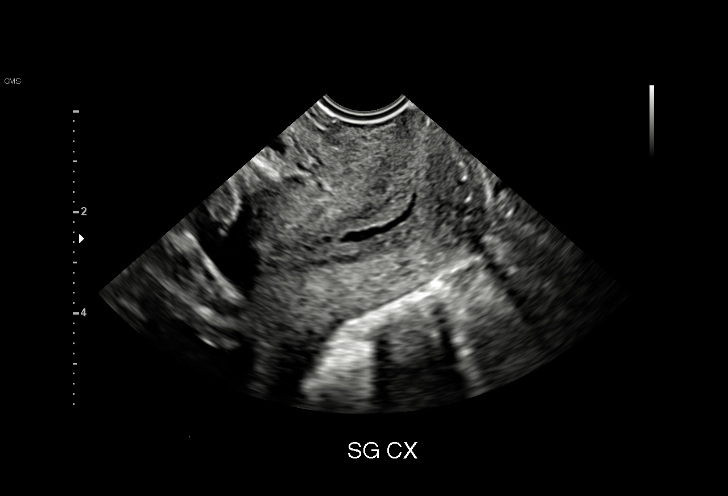

[14 of 28 positions shown; findings below may reference images not displayed]

[REDACTED]

Indications

 16 weeks gestation of pregnancy
 Encounter for uncertain dates
 Encounter for cervical length
 Cervical incompetence, second trimester
 Poor obstetric history: Previous preterm
 delivery, antepartum (21 weeks)
 Obesity complicating pregnancy, second
 trimester (pregravid BMI 42)
Fetal Evaluation

 Num Of Fetuses:         1
 Fetal Heart Rate(bpm):  158
 Cardiac Activity:       Observed
 Presentation:           Variable
 Placenta:               Anterior
 P. Cord Insertion:      Not well visualized

 Amniotic Fluid
 AFI FV:      Within normal limits

                             Largest Pocket(cm)

Biometry

 BPD:      33.4  mm     G. Age:  16w 3d         51  %    CI:        70.52   %    70 - 86
                                                         FL/HC:      14.9   %    13.3 -
 HC:      126.8  mm     G. Age:  16w 3d         44  %    HC/AC:      1.22        1.05 -
 AC:       104   mm     G. Age:  16w 3d         54  %    FL/BPD:     56.6   %
 FL:       18.9  mm     G. Age:  15w 4d         20  %    FL/AC:      18.2   %    20 - 24
 CER:      15.2  mm     G. Age:  16w 0d         15  %
 NFT:       4.9  mm

 LV:        5.9  mm
 CM:        3.3  mm

 Est. FW:     143  gm      0 lb 5 oz     26  %
OB History

 Gravidity:    2         Term:   0        Prem:   1        SAB:   0
 TOP:          0       Ectopic:  0        Living: 0
Gestational Age

 LMP:           20w 6d        Date:  02/10/21                 EDD:   11/17/21
 U/S Today:     16w 2d                                        EDD:   12/19/21
 Best:          16w 2d     Det. By:  U/S (07/06/21)           EDD:   12/19/21
Anatomy

 Cranium:               Appears normal         Stomach:                Appears normal, left
                                                                       sided
 Cavum:                 Visualized             Abdominal Wall:         Appears nml (cord
                                                                       insert, abd wall)
 Ventricles:            Appears normal         Cord Vessels:           Appears normal (3
                                                                       vessel cord)
 Choroid Plexus:        Appears normal         Kidneys:                Visualized
 Cerebellum:            Appears normal         Bladder:                Appears normal
 Posterior Fossa:       Appears normal         Upper Extremities:      Visualized
 Nuchal Fold:           Visualized             Lower Extremities:      Visualized
 Face:                  Profile nl; orbits not
                        well visualized

 Other:  Technically difficult due to early gestational age.
Cervix Uterus Adnexa

 Cervix
 Length:           3.13  cm.
 Normal appearance by transvaginal scan

 Uterus
 No abnormality visualized.

 Right Ovary
 Not visualized.

 Left Ovary
 Not visualized.
 Cul De Sac
 No free fluid seen.

 Adnexa
 No abnormality visualized.
Impression

 Single intrauterine pregnancy here for a complete anatomy
 due to uncertain gestational age and history of cervical
 incompetence
 Normal anatomy with measurements consistent with today's
 ultrasound.
 There is good fetal movement and amniotic fluid volume
 Suboptimal views of the fetal anatomy were obtained
 secondary to fetal position.
Recommendations

 Detailed anatomy and CL scheduled on [DATE]

## 2023-04-13 IMAGING — US US MFM OB FOLLOW-UP
1 series · 13 of 28 positions shown · non-contrast
Comparison: none

[Series 1: us mfm ob follow-up · 59 acquisitions, 13 frames shown]
[im 3/59]
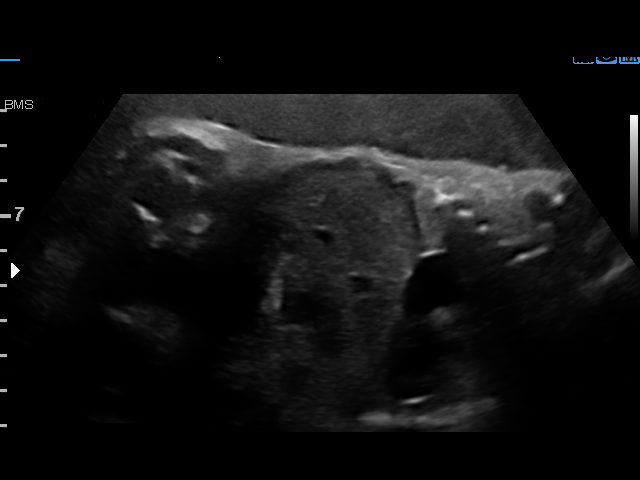
[im 7/59]
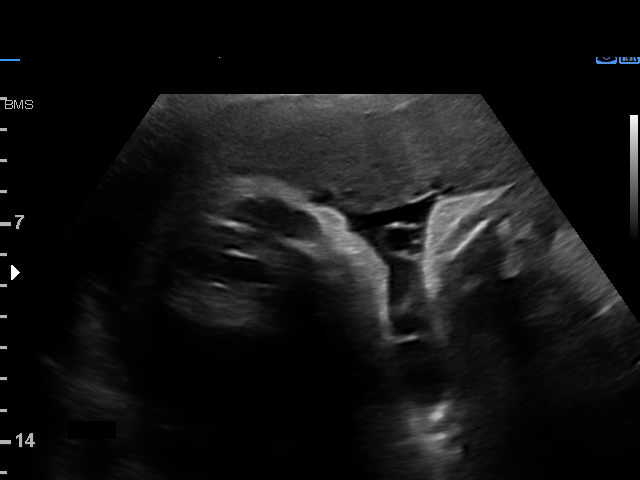
[im 11/59]
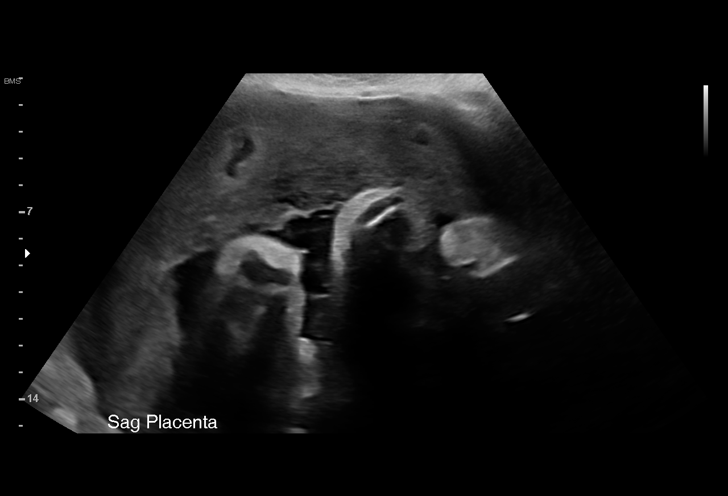
[im 16/59]
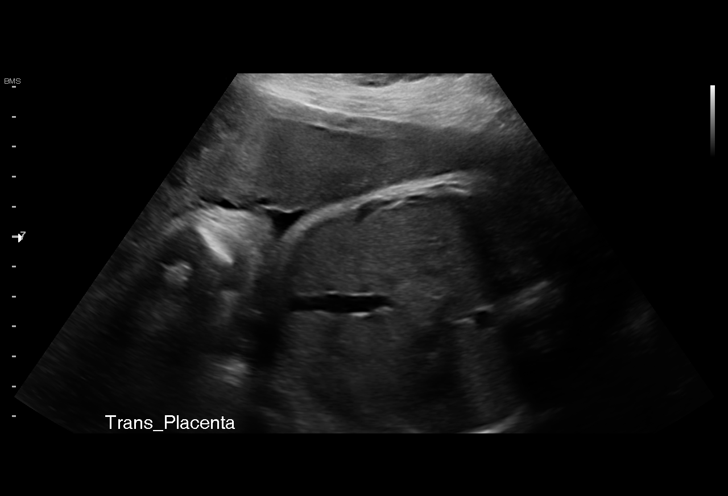
[im 20/59]
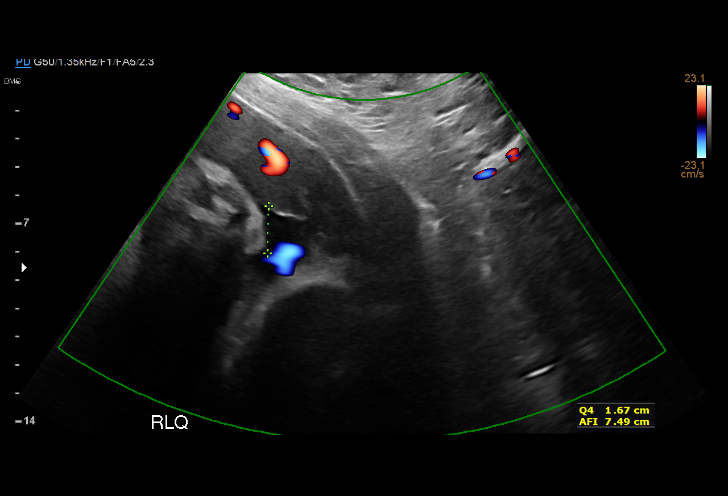
[im 24/59]
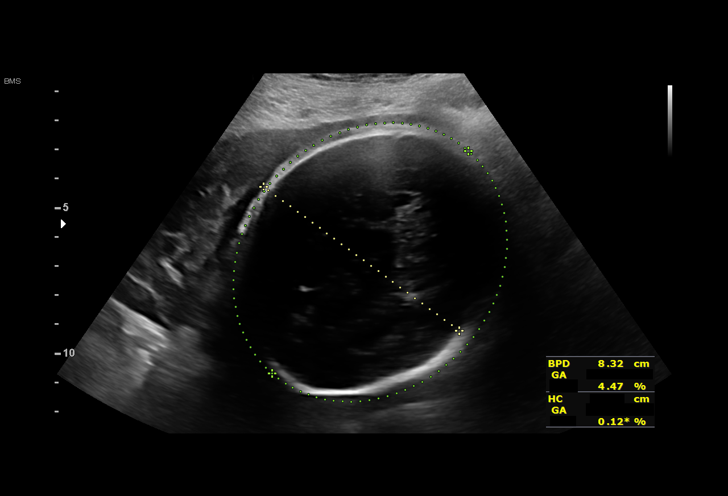
[im 31/59]
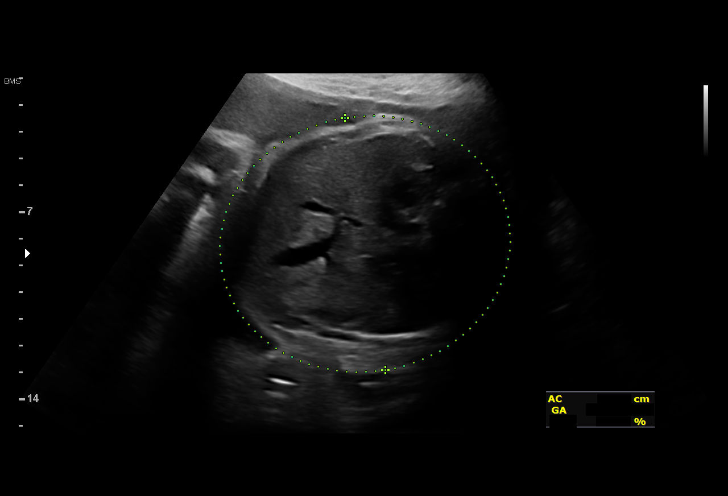
[im 35/59]
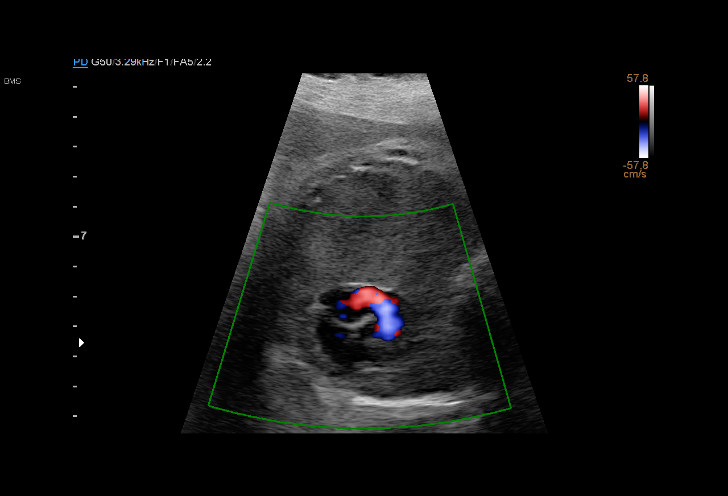
[im 39/59]
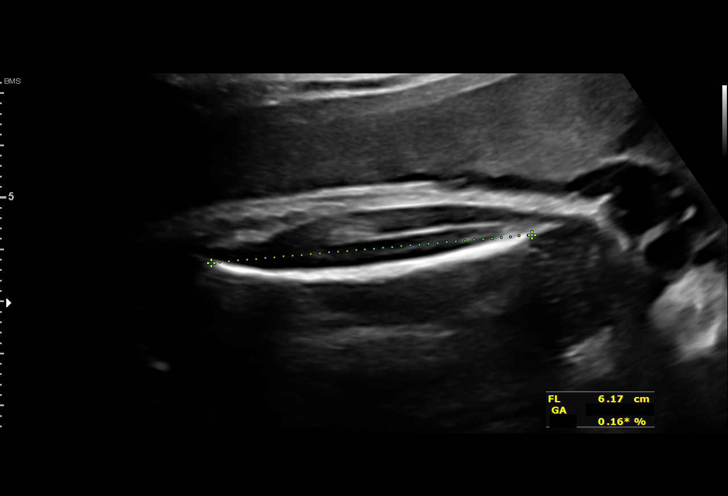
[im 43/59]
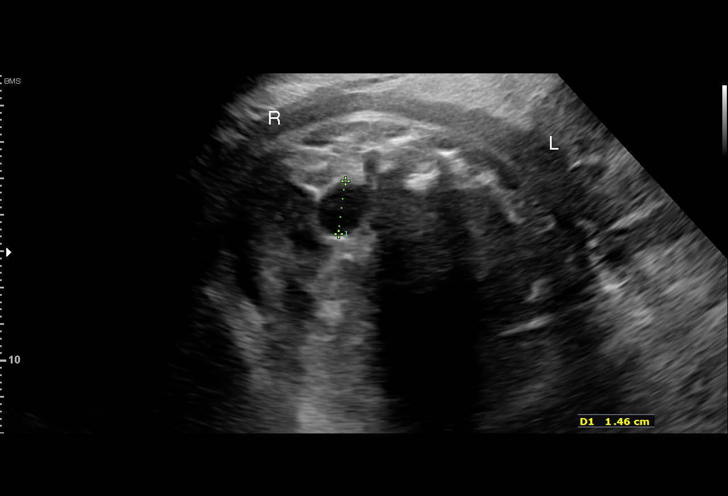
[im 48/59]
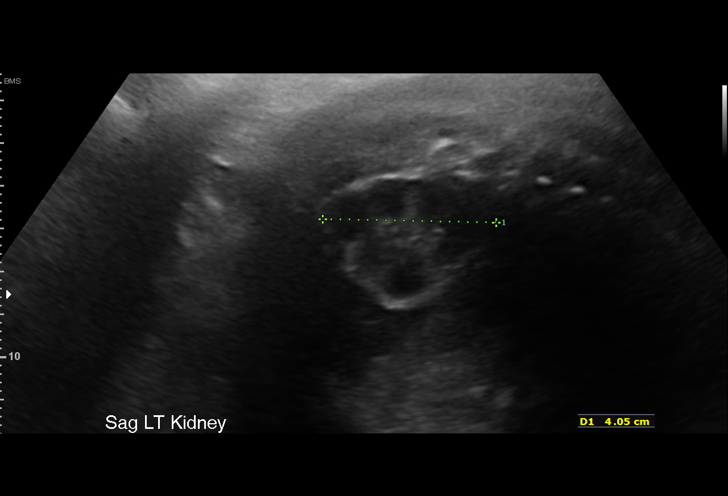
[im 52/59]
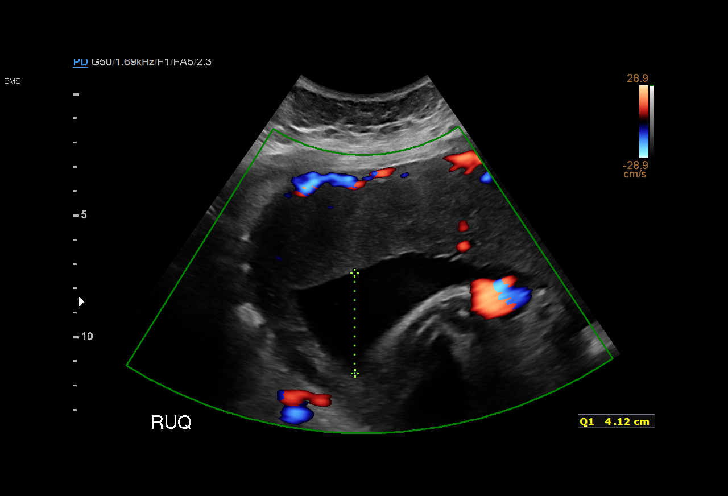
[im 56/59]
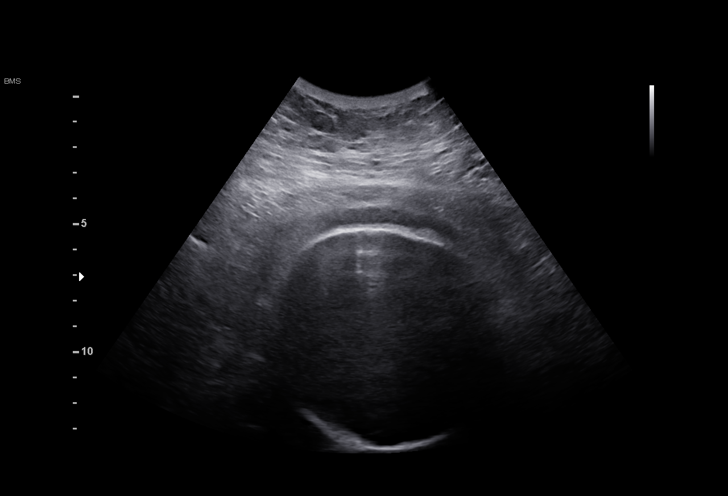

[13 of 28 positions shown; findings below may reference images not displayed]

[REDACTED]

                                                      LEDEZMA

Indications

 Pyelectasis of fetus on prenatal ultrasound
 Poor obstetric history: Previous preterm
 delivery, antepartum (21 weeks)
 Obesity complicating pregnancy, third
 trimester (BMI 42)
 LOW risk NIPS/ Negative Horizon/Negative
 AFP
 36 weeks gestation of pregnancy
Fetal Evaluation

 Num Of Fetuses:         1
 Fetal Heart Rate(bpm):  162
 Cardiac Activity:       Observed
 Presentation:           Cephalic
 Placenta:               Anterior
 P. Cord Insertion:      Visualized, central

 Amniotic Fluid
 AFI FV:      Within normal limits

 AFI Sum(cm)     %Tile       Largest Pocket(cm)
 9.29            17
 RUQ(cm)       RLQ(cm)       LUQ(cm)        LLQ(cm)

Biophysical Evaluation

 Amniotic F.V:   Pocket => 2 cm             F. Tone:        Observed
 F. Movement:    Observed                   Score:          [DATE]
 F. Breathing:   Observed
Biometry

 BPD:      83.8  mm     G. Age:  33w 5d          7  %    CI:         77.7   %    70 - 86
                                                         FL/HC:      20.3   %    20.1 -
 HC:      300.9  mm     G. Age:  33w 3d        < 1  %    HC/AC:      0.94        0.93 -
 AC:      320.7  mm     G. Age:  36w 0d         60  %    FL/BPD:     73.0   %    71 - 87
 FL:       61.2  mm     G. Age:  31w 5d        < 1  %    FL/AC:      19.1   %    20 - 24

 LV:        5.6  mm

 Est. FW:    9106  gm      5 lb 5 oz     13  %
OB History

 Gravidity:    2         Term:   0        Prem:   1        SAB:   0
 TOP:          0       Ectopic:  0        Living: 0
Gestational Age

 LMP:           40w 4d        Date:  02/10/21                 EDD:   11/17/21
 U/S Today:     33w 5d                                        EDD:   01/04/22
 Best:          36w 0d     Det. By:  U/S  (07/06/21)          EDD:   12/19/21
Anatomy

 Cranium:               Appears normal         LVOT:                   Not well visualized
 Cavum:                 Appears normal         Aortic Arch:            Not well visualized
 Ventricles:            Appears normal         Ductal Arch:            Not well visualized
 Choroid Plexus:        Previously seen        Diaphragm:              Previously seen
 Cerebellum:            Appears normal         Stomach:                Appears normal, left
                                                                       sided
 Posterior Fossa:       Previously seen        Abdomen:                Appears normal
 Nuchal Fold:           Not applicable (>20    Abdominal Wall:         Previously seen
                        wks GA)
 Face:                  Profile/orbits         Cord Vessels:           Appears normal (3
                        previously seen.                               vessel cord)
 Lips:                  Previously seen        Kidneys:                Right UTD 14.6 mm
 Palate:                Not well visualized    Bladder:                Appears normal
 Thoracic:              Appears normal         Spine:                  Previously seen
 Heart:                 Not well visualized    Upper Extremities:      Previously seen
 RVOT:                  Appears normal         Lower Extremities:      Previously seen

 Other:  Fetus appears to be female. Technically difficult due to maternal
         habitus and late gestational age.
Cervix Uterus Adnexa
 Cervix
 Not visualized (advanced GA >23wks)
Comments

 This patient was seen for a follow up growth scan and BPP
 due to maternal obesity with a BMI of 42.  She denies any
 problems since her last exam.
 She was informed that the fetal growth and amniotic fluid
 level appears appropriate for her gestational age.  The overall
 EFW continues to measure in the lower normal range for her
 gestational age (13th percentile).  The pattern of fetal growth
 is similar to that compared to her prior exams.
 Right hydronephrosis measuring 1.4 to 1.5 cm continues to
 be noted on today's exam.  This finding is stable as
 compared to her prior exams.  She was advised to notify the
 pediatrician after birth regarding the right hydronephrosis that
 has been noted during her prenatal ultrasounds.  Her
 pediatrician will order further imaging studies of the kidneys
 to determine if any treatment or prophylactic antibiotics are
 necessary.
 A BPP performed today was [DATE].
 As her BMI is greater than 40, we will continue to follow her
 with weekly BPP's.
 Another BPP scheduled in 1 week.
 As the overall EFW measures in the lower normal range,
 delivery should be considered at around 39 weeks.

Recommendations

 Weekly BPP's until delivery
 Delivery at 39 weeks

## 2023-04-23 IMAGING — US US MFM FETAL BPP W/O NON-STRESS
1 series · 15 of 26 positions shown · non-contrast
Comparison: none

[Series 1: us mfm fetal bpp w/o non-stress · 26 acquisitions, 15 frames shown]
[im 1/26]
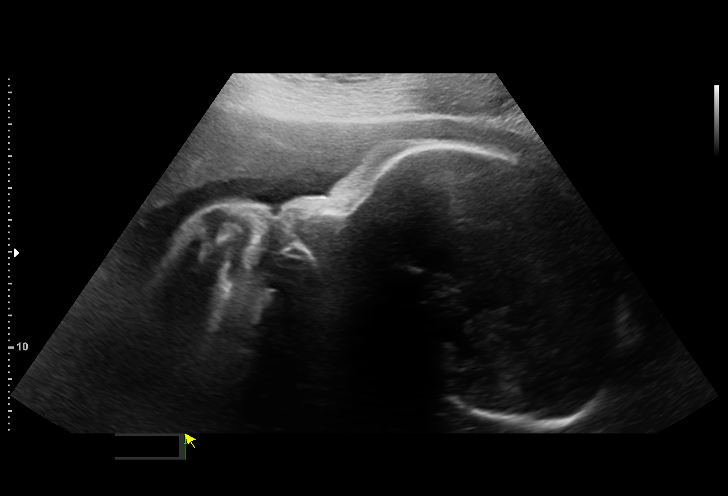
[im 3/26]
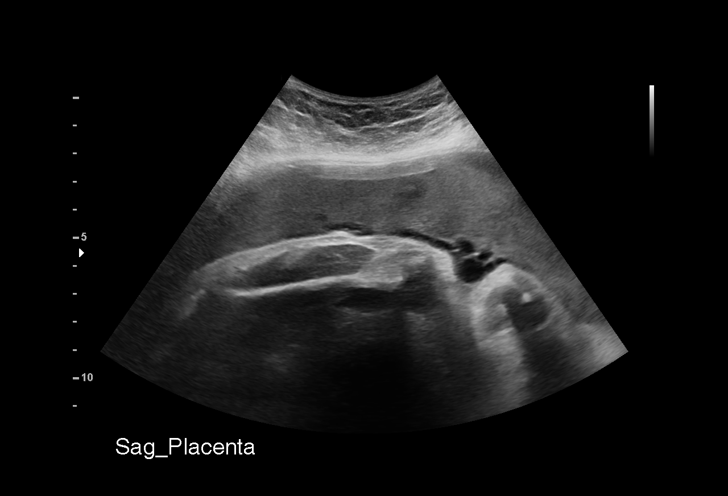
[im 5/26]
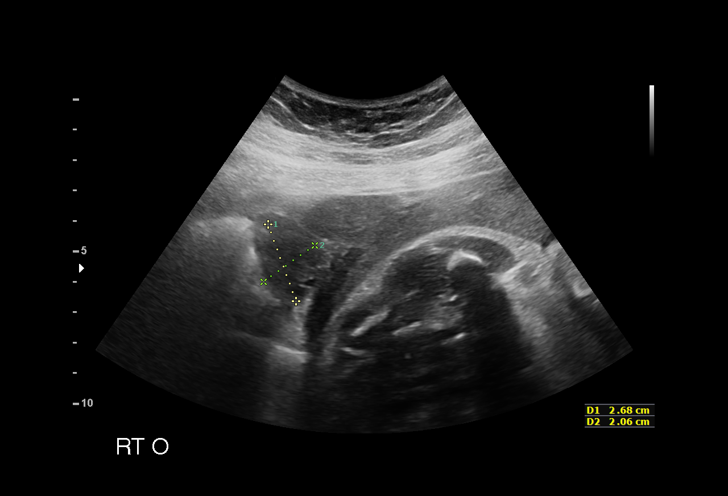
[im 7/26]
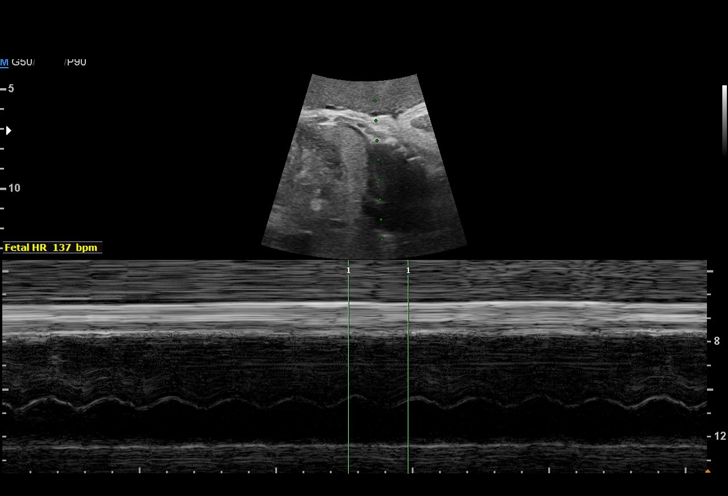
[im 8/26]
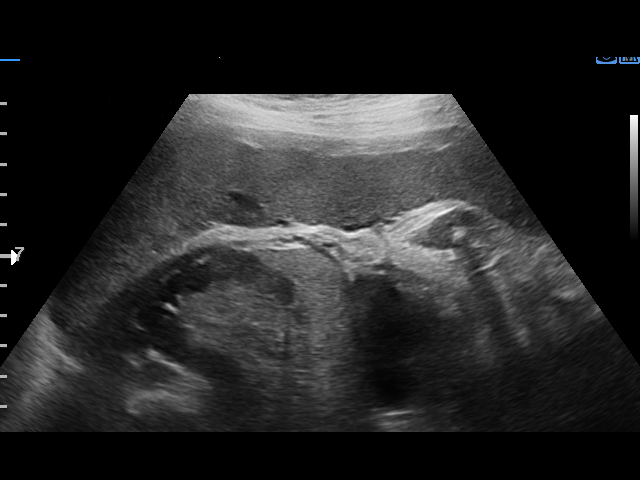
[im 10/26]
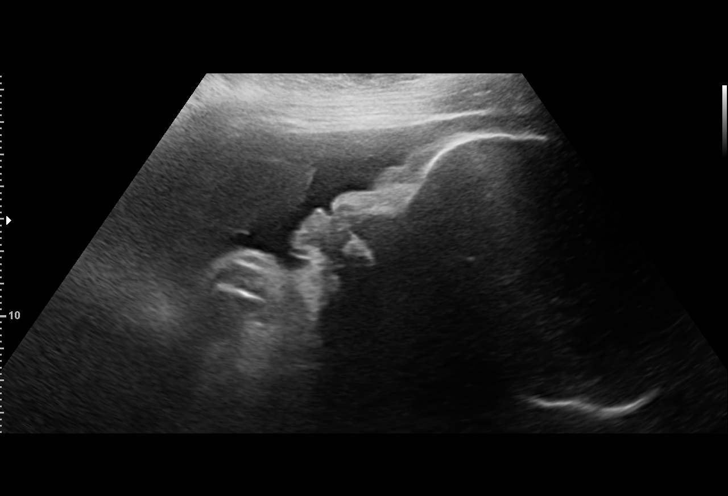
[im 12/26]
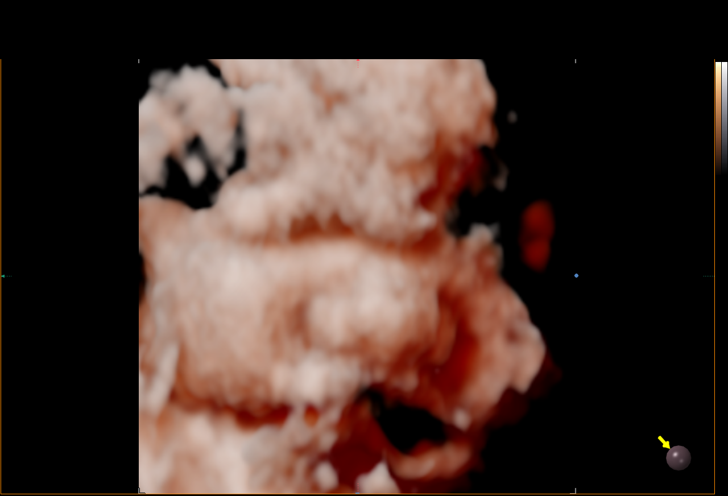
[im 14/26]
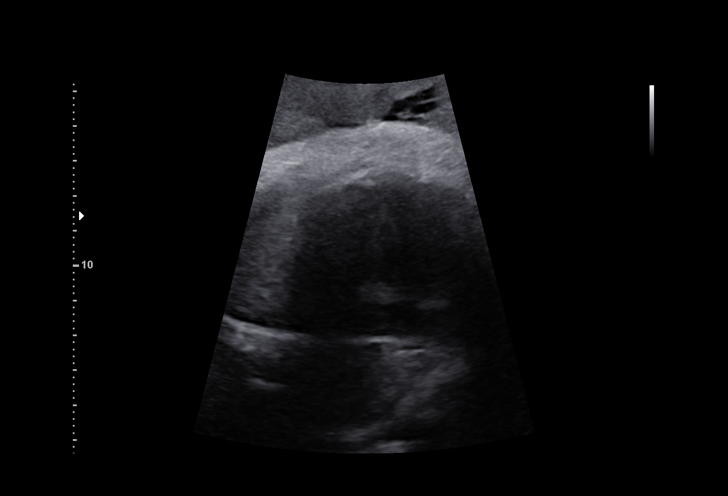
[im 15/26]
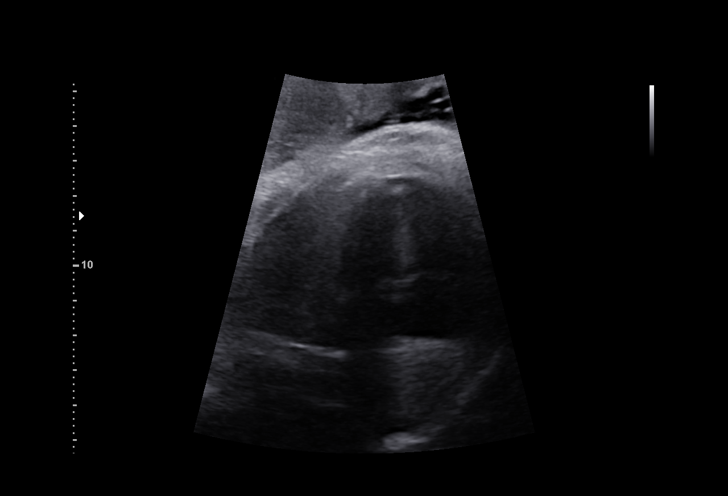
[im 17/26]
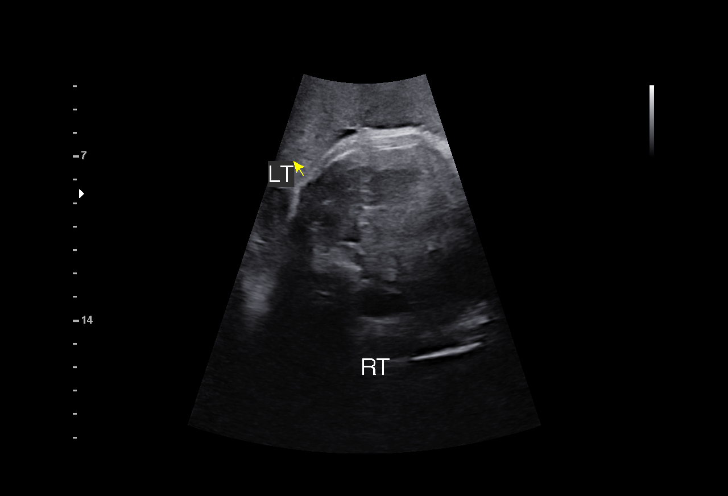
[im 19/26]
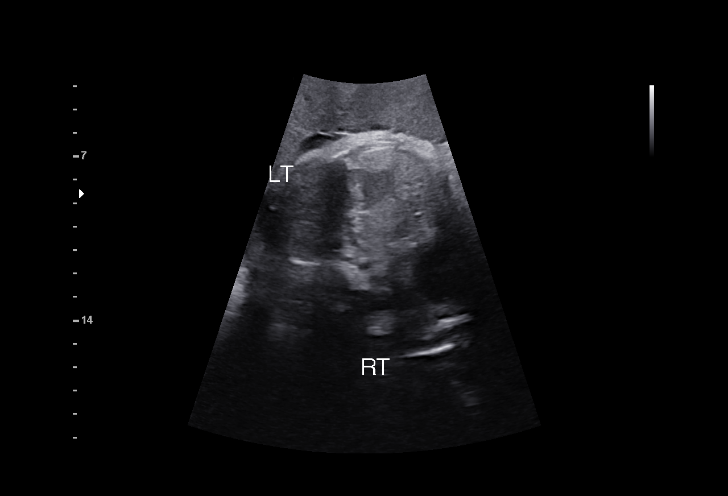
[im 20/26]
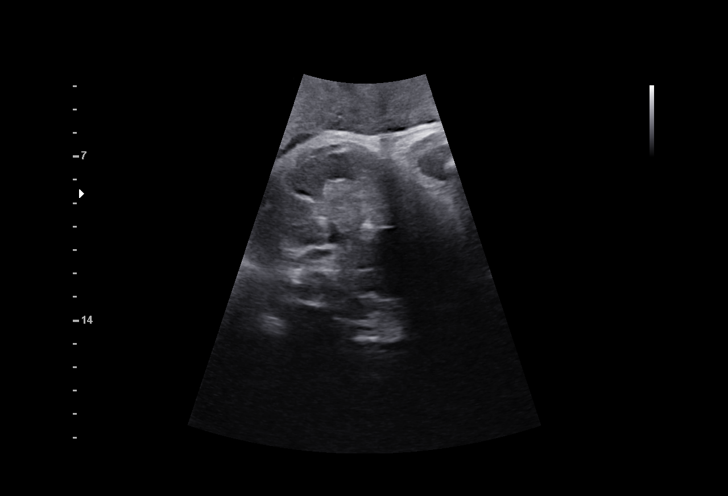
[im 22/26]
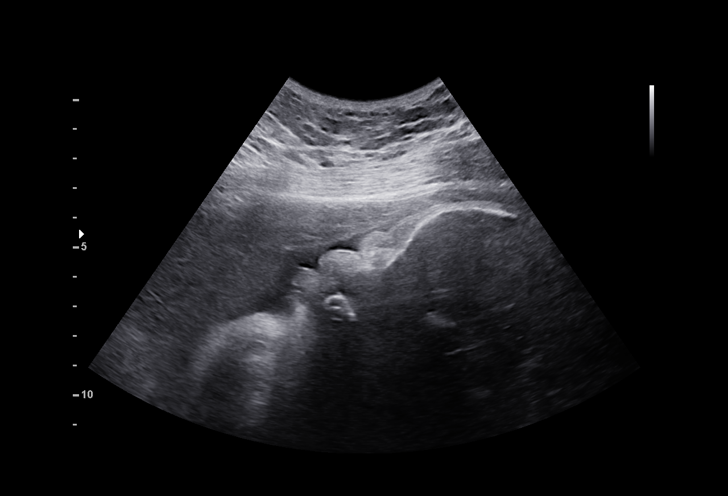
[im 24/26]
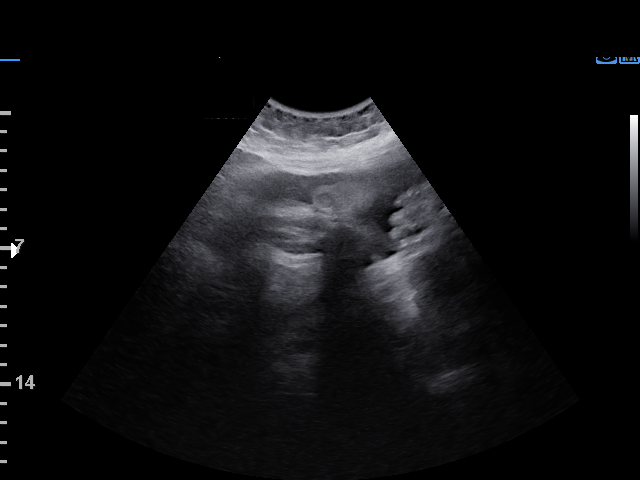
[im 26/26]
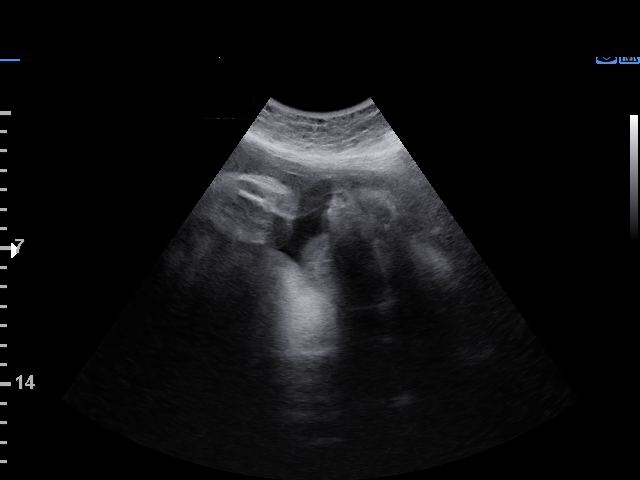

[15 of 26 positions shown; findings below may reference images not displayed]

[REDACTED]

Indications

 Obesity complicating pregnancy, third
 trimester (BMI 42)
 37 weeks gestation of pregnancy
 Pyelectasis of fetus on prenatal ultrasound
 Poor obstetric history: Previous preterm
 delivery, antepartum (21 weeks)
 LOW risk NIPS/ Negative Horizon/Negative
 AFP
Fetal Evaluation

 Num Of Fetuses:         1
 Fetal Heart Rate(bpm):  137
 Cardiac Activity:       Observed
 Placenta:               Anterior
 P. Cord Insertion:      Previously Visualized

 Amniotic Fluid
 AFI FV:      Within normal limits

 AFI Sum(cm)     %Tile       Largest Pocket(cm)
 10.29           27

 RUQ(cm)       RLQ(cm)       LUQ(cm)        LLQ(cm)

Biophysical Evaluation

 Amniotic F.V:   Within normal limits       F. Tone:        Observed
 F. Movement:    Observed                   Score:          [DATE]
 F. Breathing:   Observed
OB History

 Gravidity:    2         Term:   0        Prem:   1        SAB:   0
 TOP:          0       Ectopic:  0        Living: 0
Gestational Age

 LMP:           42w 0d        Date:  02/10/21                 EDD:   11/17/21
 Best:          37w 3d     Det. By:  U/S  (07/06/21)          EDD:   12/19/21
Anatomy

 Ventricles:            Appears normal         Kidneys:                RT UTD 11 mm
 Heart:                 Appears normal         Bladder:                Appears normal
                        (4CH, axis, and
                        situs)
 Stomach:               Appears normal, left
                        sided
Cervix Uterus Adnexa

 Cervix
 Not visualized (advanced GA >94wks)

 Right Ovary
 Visualized.

 Left Ovary
 Visualized.
Comments

 This patient was seen for a BPP due to maternal obesity with
 a BMI of 42.  She denies any problems since her last exam.
 Right hydronephrosis measuring 1.1 cm continues to be
 noted on today's exam.  This finding is stable as compared to
 her prior exams.  She was advised to notify the pediatrician
 after birth regarding the right hydronephrosis that has been
 noted during her prenatal ultrasounds.  Her pediatrician will
 order further imaging studies of the kidneys to determine if
 any treatment or prophylactic antibiotics are necessary.
 A BPP performed today was [DATE].
 Another BPP was scheduled in 1 week.
 She is already scheduled for delivery on December 13, 2021.

## 2024-09-11 ENCOUNTER — Ambulatory Visit

## 2024-09-16 ENCOUNTER — Encounter

## 2024-09-17 ENCOUNTER — Encounter

## 2024-09-19 ENCOUNTER — Other Ambulatory Visit (INDEPENDENT_AMBULATORY_CARE_PROVIDER_SITE_OTHER): Payer: Self-pay

## 2024-09-19 ENCOUNTER — Other Ambulatory Visit (HOSPITAL_COMMUNITY)
Admission: RE | Admit: 2024-09-19 | Discharge: 2024-09-19 | Disposition: A | Discharge status: Home / Self Care | Source: Ambulatory Visit | Attending: Obstetrics and Gynecology | Admitting: Obstetrics and Gynecology

## 2024-09-19 ENCOUNTER — Ambulatory Visit (INDEPENDENT_AMBULATORY_CARE_PROVIDER_SITE_OTHER): Admitting: *Deleted

## 2024-09-19 VITALS — BP 126/84 | HR 86 | Wt 194.5 lb

## 2024-09-19 DIAGNOSIS — O0990 Supervision of high risk pregnancy, unspecified, unspecified trimester: Secondary | ICD-10-CM | POA: Insufficient documentation

## 2024-09-19 DIAGNOSIS — Z3A09 9 weeks gestation of pregnancy: Secondary | ICD-10-CM | POA: Diagnosis not present

## 2024-09-19 DIAGNOSIS — O0991 Supervision of high risk pregnancy, unspecified, first trimester: Secondary | ICD-10-CM | POA: Diagnosis not present

## 2024-09-19 DIAGNOSIS — O219 Vomiting of pregnancy, unspecified: Secondary | ICD-10-CM

## 2024-09-19 DIAGNOSIS — O3680X0 Pregnancy with inconclusive fetal viability, not applicable or unspecified: Secondary | ICD-10-CM

## 2024-09-19 DIAGNOSIS — Z3A08 8 weeks gestation of pregnancy: Secondary | ICD-10-CM | POA: Diagnosis not present

## 2024-09-19 MED ORDER — PROMETHAZINE HCL 25 MG PO TABS: 25.0000 mg | ORAL_TABLET | Freq: Four times a day (QID) | ORAL | 1 refills | Status: DC | PRN

## 2024-09-19 MED ORDER — DOXYLAMINE-PYRIDOXINE 10-10 MG PO TBEC: 2.0000 | DELAYED_RELEASE_TABLET | Freq: Every day | ORAL | 5 refills | Status: AC

## 2024-09-19 MED ORDER — OMEPRAZOLE MAGNESIUM 20 MG PO TBEC: 20.0000 mg | DELAYED_RELEASE_TABLET | Freq: Every day | ORAL | 2 refills | Status: AC

## 2024-09-19 MED ORDER — BLOOD PRESSURE KIT DEVI: 1.0000 | 0 refills | Status: DC

## 2024-09-19 NOTE — Progress Notes (Signed)
 New OB Intake  I connected with Olivia Taylor  on 09/19/24 at  9:15 AM EDT by In Person Visit and verified that I am speaking with the correct person using two identifiers. Nurse is located at [facility] and pt is located at [facility].  I discussed the limitations, risks, security and privacy concerns of performing an evaluation and management service by telephone and the availability of in person appointments. I also discussed with the patient that there may be a patient responsible charge related to this service. The patient expressed understanding and agreed to proceed.  I explained I am completing New OB Intake today. We discussed EDD of 04/24/25 based on LMP of 07/18/24. Pt is G3P1101. I reviewed her allergies, medications and Medical/Surgical/OB history.    Patient Active Problem List   Diagnosis Date Noted   Supervision of high risk pregnancy, antepartum 09/19/2024   Influenza A 12/16/2021   Gestational hypertension 12/14/2021   Pregnancy 12/13/2021   Positive GBS test 11/29/2021   Pyelectasis of fetus on prenatal ultrasound 07/28/2021   ASCUS of cervix with negative high risk HPV 06/14/2021   History of incompetent cervix, currently pregnant 06/14/2021     Concerns addressed today  Delivery Plans Plans to deliver at [facility]. Discussed the nature of our practice with multiple providers including residents and students as well as female and female providers. Due to the size of the practice, the delivering provider may not be the same as those providing prenatal care.   Patient is not interested in water birth.  MyChart/Babyscripts MyChart access verified. I explained pt will have some visits in office and some virtually. Babyscripts instructions given and order placed. Patient verifies receipt of registration text/e-mail. Account successfully created and app downloaded. If patient is a candidate for Optimized scheduling, add to sticky note.   Blood Pressure Cuff/Weight Scale Blood  pressure cuff ordered for patient to pick-up from [employer]. Explained after first prenatal appt pt will check weekly and document in Babyscripts. Patient does not have weight scale; patient may purchase if they desire to track weight weekly in Babyscripts.  Anatomy US  Explained first scheduled US  will be around 19 weeks. Anatomy US  scheduled for TBD at TBD.  Is patient a candidate for Babyscripts Optimization? No, due to Risk Factors   First visit review I reviewed new OB appt with patient. Explained pt will be seen by Nidia Daring, NP at first visit. Discussed [place] genetic screening with patient. Requests Panorama. Routine prenatal labs OB Urine, GC/CC collected at today's visit. Initial prenatal labs deferred to New OB appt.   Last Pap No results found for: DIAGPAP  [name], RN 09/19/2024  1:46 PM

## 2024-09-19 NOTE — Patient Instructions (Signed)

## 2024-09-22 LAB — CERVICOVAGINAL ANCILLARY ONLY
Chlamydia: NEGATIVE
Comment: NEGATIVE
Comment: NORMAL
Neisseria Gonorrhea: NEGATIVE

## 2024-10-08 ENCOUNTER — Ambulatory Visit (INDEPENDENT_AMBULATORY_CARE_PROVIDER_SITE_OTHER): Admitting: Obstetrics and Gynecology

## 2024-10-08 ENCOUNTER — Encounter: Payer: Self-pay | Admitting: Obstetrics and Gynecology

## 2024-10-08 ENCOUNTER — Other Ambulatory Visit (HOSPITAL_COMMUNITY)
Admission: RE | Admit: 2024-10-08 | Discharge: 2024-10-08 | Disposition: A | Source: Ambulatory Visit | Attending: Obstetrics and Gynecology | Admitting: Obstetrics and Gynecology

## 2024-10-08 VITALS — BP 115/73 | HR 98 | Wt 196.0 lb

## 2024-10-08 DIAGNOSIS — Z8759 Personal history of other complications of pregnancy, childbirth and the puerperium: Secondary | ICD-10-CM | POA: Diagnosis not present

## 2024-10-08 DIAGNOSIS — Z8751 Personal history of pre-term labor: Secondary | ICD-10-CM

## 2024-10-08 DIAGNOSIS — Z3480 Encounter for supervision of other normal pregnancy, unspecified trimester: Secondary | ICD-10-CM

## 2024-10-08 DIAGNOSIS — Z3A11 11 weeks gestation of pregnancy: Secondary | ICD-10-CM

## 2024-10-08 MED ORDER — ASPIRIN 81 MG PO TBEC: 81.0000 mg | DELAYED_RELEASE_TABLET | Freq: Every day | ORAL | 2 refills | Status: DC

## 2024-10-08 NOTE — Progress Notes (Signed)
 Pt presents for ROB visit. No concerns

## 2024-10-08 NOTE — Progress Notes (Signed)
 INITIAL PRENATAL VISIT  Subjective:   Olivia Taylor is being seen today for her first obstetrical visit.  She is at 105w5d gestation by LMP Her obstetrical history is significant for hx of PTL/PTD 21 weeks IUFD first pregnancy.Patient does intend to breast feed. Pregnancy history fully reviewed.   Indications for ASA therapy (per uptodate) One of the following: Previous pregnancy with preeclampsia, especially early onset and with an adverse outcome No- gestational hypertension  Multifetal gestation No Chronic hypertension No Type 1 or 2 diabetes mellitus No Chronic kidney disease No Autoimmune disease (antiphospholipid syndrome, systemic lupus erythematosus) No   Objective:    Obstetric History OB History  Gravida Para Term Preterm AB Living  3 2 1 1  0 1  SAB IAB Ectopic Multiple Live Births  0    2    # Outcome Date GA Lbr Len/2nd Weight Sex Type Anes PTL Lv  3 Current           2 Term 12/14/21 [redacted]w[redacted]d 19:21 / 00:21 5 lb 14.2 oz (2.67 kg) F Vag-Spont EPI  LIV  1 Preterm 03/27/20 [redacted]w[redacted]d  12 oz (0.339 kg) F Vag-Spont None Y ND     Birth Comments: neonatal death, breech delivery     Complications: Nonviable pregnancy    Past Medical History:  Diagnosis Date   Headache    Hypertension    during last pregnancy and postpartum   Preterm labor     Past Surgical History:  Procedure Laterality Date   NO PAST SURGERIES      Current Outpatient Medications on File Prior to Visit  Medication Sig Dispense Refill   acetaminophen  (TYLENOL ) 500 MG tablet Take 500 mg by mouth every 6 (six) hours as needed for headache.     Blood Pressure Monitoring (BLOOD PRESSURE CUFF) MISC 1 each by Does not apply route daily. 1 each 0   Blood Pressure Monitoring (BLOOD PRESSURE KIT) DEVI 1 Device by Does not apply route once a week. 1 each 0   Doxylamine-Pyridoxine (DICLEGIS) 10-10 MG TBEC Take 2 tablets by mouth at bedtime. If symptoms persist, add one tablet in the morning and one in the  afternoon 100 tablet 5   omeprazole (PRILOSEC OTC) 20 MG tablet Take 1 tablet (20 mg total) by mouth daily. 30 tablet 2   Prenatal MV & Min w/FA-DHA (PRENATAL ADULT GUMMY/DHA/FA PO) Take by mouth.     promethazine  (PHENERGAN ) 25 MG tablet Take 1 tablet (25 mg total) by mouth every 6 (six) hours as needed for nausea or vomiting. 30 tablet 1   No current facility-administered medications on file prior to visit.    No Known Allergies  Social History:  reports that she has never smoked. She has never used smokeless tobacco. She reports that she does not currently use alcohol. She reports that she does not currently use drugs after having used the following drugs: Marijuana.  Family History  Problem Relation Age of Onset   Hypertension Mother    Crohn's disease Mother    Hypertension Maternal Grandmother    Cancer Maternal Grandmother     The following portions of the patient's history were reviewed and updated as appropriate: allergies, current medications, past family history, past medical history, past social history, past surgical history and problem list.  Review of Systems Review of Systems    Physical Exam:  BP 115/73   Pulse 98   Wt 196 lb (88.9 kg)   LMP 07/18/2024 (Approximate)   BMI 37.03 kg/m  CONSTITUTIONAL: Well-developed, well-nourished female in no acute distress.  HENT:  Normocephalic, atraumatic.   EYES: Conjunctivae normal. NECK: Normal range of motion SKIN: Skin is warm and dry. MUSCULOSKELETAL: Normal range of motion NEUROLOGIC: Alert and oriented  PSYCHIATRIC: Normal mood and affect. Normal behavior.  CARDIOVASCULAR: Normal heart rate noted RESPIRATORY: normal effort ABDOMEN: Soft PELVIC:**      Movement: Absent       Assessment:    Pregnancy: G3P1101  1. Supervision of other normal pregnancy, antepartum (Primary) BP and FHR normal Pap today   - Cytology - PAP( [place]) - CBC/D/Plt+RPR+Rh+ABO+RubIgG... - HgB A1c - Comp Met (CMET) -  PANORAMA PRENATAL TEST  2. History of gestational hypertension Discussed recommendation for ASA in pregnancy, rx sent  - Comp Met (CMET) - aspirin  EC 81 MG tablet; Take 1 tablet (81 mg total) by mouth daily. Start taking when you are [redacted] weeks pregnant for rest of pregnancy for prevention of preeclampsia  Dispense: 300 tablet; Refill: 2  3. [redacted] weeks gestation of pregnancy   4. History of preterm delivery 21 week IUFD, dx with cervical incompetence first pregnancy       Plan:     Initial labs drawn. Prenatal vitamins. Problem list reviewed and updated. Reviewed in detail the nature of the practice with collaborative care between  Genetic screening discussed: NIPS/First trimester screen/Quad/AFP ordered. Role of ultrasound in pregnancy discussed; Anatomy US : ordered.  Discussed clinic routines, schedule of care and testing, genetic screening options, involvement of students and residents under the direct supervision of APPs and doctors and presence of female providers. Pt verbalized understanding.  Return in 4 weeks OB visit   Delores Nidia CROME, FNP

## 2024-10-09 ENCOUNTER — Ambulatory Visit: Payer: Self-pay | Admitting: Obstetrics and Gynecology

## 2024-10-09 DIAGNOSIS — O0990 Supervision of high risk pregnancy, unspecified, unspecified trimester: Secondary | ICD-10-CM

## 2024-10-09 LAB — HEMOGLOBIN A1C
Est. average glucose Bld gHb Est-mCnc: 108 mg/dL
Hgb A1c MFr Bld: 5.4 % (ref 4.8–5.6)

## 2024-10-09 LAB — CBC/D/PLT+RPR+RH+ABO+RUBIGG...
Antibody Screen: NEGATIVE
Basophils Absolute: 0 x10E3/uL (ref 0.0–0.2)
Basos: 0 %
EOS (ABSOLUTE): 0 x10E3/uL (ref 0.0–0.4)
Eos: 0 %
HCV Ab: NONREACTIVE
HIV Screen 4th Generation wRfx: NONREACTIVE
Hematocrit: 34.3 % (ref 34.0–46.6)
Hemoglobin: 10.9 g/dL — ABNORMAL LOW (ref 11.1–15.9)
Hepatitis B Surface Ag: NEGATIVE
Immature Grans (Abs): 0 x10E3/uL (ref 0.0–0.1)
Immature Granulocytes: 0 %
Lymphocytes Absolute: 3 x10E3/uL (ref 0.7–3.1)
Lymphs: 31 %
MCH: 27.9 pg (ref 26.6–33.0)
MCHC: 31.8 g/dL (ref 31.5–35.7)
MCV: 88 fL (ref 79–97)
Monocytes Absolute: 0.7 x10E3/uL (ref 0.1–0.9)
Monocytes: 7 %
Neutrophils Absolute: 6 x10E3/uL (ref 1.4–7.0)
Neutrophils: 62 %
Platelets: 335 x10E3/uL (ref 150–450)
RBC: 3.91 x10E6/uL (ref 3.77–5.28)
RDW: 15.1 % (ref 11.7–15.4)
RPR Ser Ql: NONREACTIVE
Rh Factor: POSITIVE
Rubella Antibodies, IGG: 4.64 {index} (ref >0.99)
WBC: 9.7 x10E3/uL (ref 3.4–10.8)

## 2024-10-09 LAB — COMPREHENSIVE METABOLIC PANEL WITH GFR
ALT: 10 IU/L (ref 0–32)
AST: 10 IU/L (ref 0–40)
Albumin: 4 g/dL (ref 4.0–5.0)
Alkaline Phosphatase: 56 IU/L (ref 41–116)
BUN/Creatinine Ratio: 10 (ref 9–23)
BUN: 6 mg/dL (ref 6–20)
Bilirubin Total: <0.2 mg/dL (ref 0.0–1.2)
CO2: 19 mmol/L — ABNORMAL LOW (ref 20–29)
Calcium: 9.4 mg/dL (ref 8.7–10.2)
Chloride: 104 mmol/L (ref 96–106)
Creatinine, Ser: 0.59 mg/dL (ref 0.57–1.00)
Globulin, Total: 2.3 g/dL (ref 1.5–4.5)
Glucose: 82 mg/dL (ref 70–99)
Potassium: 4.1 mmol/L (ref 3.5–5.2)
Sodium: 139 mmol/L (ref 134–144)
Total Protein: 6.3 g/dL (ref 6.0–8.5)
eGFR: 125 mL/min/1.73 (ref >59)

## 2024-10-09 LAB — HCV INTERPRETATION

## 2024-10-13 LAB — CYTOLOGY - PAP: Diagnosis: NEGATIVE

## 2024-10-15 LAB — PANORAMA PRENATAL TEST FULL PANEL:PANORAMA TEST PLUS 5 ADDITIONAL MICRODELETIONS: FETAL FRACTION: 9

## 2024-11-05 ENCOUNTER — Encounter: Payer: Self-pay | Admitting: Obstetrics and Gynecology

## 2024-11-05 ENCOUNTER — Encounter: Admitting: Obstetrics

## 2024-11-05 ENCOUNTER — Ambulatory Visit (INDEPENDENT_AMBULATORY_CARE_PROVIDER_SITE_OTHER): Admitting: Obstetrics and Gynecology

## 2024-11-05 VITALS — BP 125/81 | HR 98 | Wt 204.4 lb

## 2024-11-05 DIAGNOSIS — O0990 Supervision of high risk pregnancy, unspecified, unspecified trimester: Secondary | ICD-10-CM

## 2024-11-05 DIAGNOSIS — Z3A15 15 weeks gestation of pregnancy: Secondary | ICD-10-CM

## 2024-11-05 DIAGNOSIS — Z8759 Personal history of other complications of pregnancy, childbirth and the puerperium: Secondary | ICD-10-CM | POA: Diagnosis not present

## 2024-11-05 DIAGNOSIS — Z8751 Personal history of pre-term labor: Secondary | ICD-10-CM

## 2024-11-05 MED ORDER — FERROUS SULFATE 325 (65 FE) MG PO TABS: ORAL_TABLET | ORAL | 1 refills | Status: AC

## 2024-11-05 MED ORDER — ASPIRIN 81 MG PO TBEC: 81.0000 mg | DELAYED_RELEASE_TABLET | Freq: Every day | ORAL | 2 refills | Status: AC

## 2024-11-05 MED ORDER — BLOOD PRESSURE KIT DEVI: 1.0000 | 0 refills | Status: AC

## 2024-11-05 NOTE — Progress Notes (Signed)
   PRENATAL VISIT NOTE  Subjective:  Olivia Taylor is a 28 y.o. G3P1101 at [redacted]w[redacted]d being seen today for ongoing prenatal care.  She is currently monitored for the following issues for this low-risk pregnancy and has ASCUS of cervix with negative high risk HPV; History of incompetent cervix, currently pregnant; History of gestational hypertension; and Supervision of high risk pregnancy, antepartum on their problem list.  Patient reports no complaints.  Contractions: Not present. Vag. Bleeding: None.  Movement: Present. Denies leaking of fluid.   The following portions of the patient's history were reviewed and updated as appropriate: allergies, current medications, past family history, past medical history, past social history, past surgical history and problem list.   Objective:   Vitals:   11/05/24 1100  BP: 125/81  Pulse: 98  Weight: 204 lb 6.4 oz (92.7 kg)    Fetal Status:  Fetal Heart Rate (bpm): 157   Movement: Present    General: Alert, oriented and cooperative. Patient is in no acute distress.  Skin: Skin is warm and dry. No rash noted.   Cardiovascular: Normal heart rate noted  Respiratory: Normal respiratory effort, no problems with respiration noted  Abdomen: Soft, gravid, appropriate for gestational age.  Pain/Pressure: Absent     Pelvic: Cervical exam deferred        Extremities: Normal range of motion.  Edema: None  Mental Status: Normal mood and affect. Normal behavior. Normal judgment and thought content.     Assessment and Plan:  Pregnancy: G3P1101 at [redacted]w[redacted]d 1. Supervision of high risk pregnancy, antepartum (Primary) BP and FHR normal   - AFP, Serum, Open Spina Bifida - Protein / creatinine ratio, urine - Culture, OB Urine  2. History of gestational hypertension Normotensive, start ASA New BP cuff sent  - Protein / creatinine ratio, urine  3. History of preterm delivery   4. [redacted] weeks gestation of pregnancy Anatomy scan 12/17 AFP today  Preterm  labor symptoms and general obstetric precautions including but not limited to vaginal bleeding, contractions, leaking of fluid and fetal movement were reviewed in detail with the patient. Please refer to After Visit Summary for other counseling recommendations.   Return in about 4 weeks (around 12/03/2024) for OB VISIT (MD or APP).  Future Appointments  Date Time Provider Department Center  12/03/2024 10:00 AM [facility] PROVIDER 1 [facility]  12/03/2024 10:30 AM [facility] US3 [facility]    Nidia Daring, FNP

## 2024-11-05 NOTE — Progress Notes (Signed)
 Pt presents for ROB visit. No concerns

## 2024-11-07 ENCOUNTER — Ambulatory Visit: Payer: Self-pay | Admitting: Obstetrics and Gynecology

## 2024-11-07 DIAGNOSIS — O0990 Supervision of high risk pregnancy, unspecified, unspecified trimester: Secondary | ICD-10-CM

## 2024-11-07 DIAGNOSIS — R8271 Bacteriuria: Secondary | ICD-10-CM

## 2024-11-07 LAB — AFP, SERUM, OPEN SPINA BIFIDA
AFP MoM: 0.58
AFP Value: 15.1 ng/mL
Gest. Age on Collection Date: 15 wk
Maternal Age At EDD: 29.9 a
OSBR Risk 1 IN: 10000
Test Results:: NEGATIVE
Weight: 204 [lb_av]

## 2024-11-07 LAB — PROTEIN / CREATININE RATIO, URINE
Creatinine, Urine: 110.4 mg/dL
Protein, Ur: 10 mg/dL
Protein/Creat Ratio: 91 mg/g{creat} (ref 0–200)

## 2024-11-10 LAB — CULTURE, OB URINE

## 2024-11-10 LAB — URINE CULTURE, OB REFLEX

## 2024-11-12 DIAGNOSIS — R8271 Bacteriuria: Secondary | ICD-10-CM | POA: Insufficient documentation

## 2024-11-20 ENCOUNTER — Other Ambulatory Visit: Payer: Self-pay

## 2024-11-20 MED ORDER — TERCONAZOLE 0.8 % VA CREA: 1.0000 | TOPICAL_CREAM | Freq: Every day | VAGINAL | 0 refills | Status: AC

## 2024-11-24 DIAGNOSIS — O99210 Obesity complicating pregnancy, unspecified trimester: Secondary | ICD-10-CM | POA: Insufficient documentation

## 2024-11-24 DIAGNOSIS — Z8759 Personal history of other complications of pregnancy, childbirth and the puerperium: Secondary | ICD-10-CM | POA: Insufficient documentation

## 2024-11-24 DIAGNOSIS — O09899 Supervision of other high risk pregnancies, unspecified trimester: Secondary | ICD-10-CM | POA: Insufficient documentation

## 2024-12-03 ENCOUNTER — Ambulatory Visit: Admitting: Obstetrics and Gynecology

## 2024-12-03 ENCOUNTER — Other Ambulatory Visit

## 2024-12-03 ENCOUNTER — Ambulatory Visit

## 2024-12-03 ENCOUNTER — Encounter: Payer: Self-pay | Admitting: Obstetrics and Gynecology

## 2024-12-03 VITALS — BP 121/85 | HR 95 | Wt 209.4 lb

## 2024-12-03 DIAGNOSIS — O99210 Obesity complicating pregnancy, unspecified trimester: Secondary | ICD-10-CM

## 2024-12-03 DIAGNOSIS — O0990 Supervision of high risk pregnancy, unspecified, unspecified trimester: Secondary | ICD-10-CM | POA: Diagnosis not present

## 2024-12-03 DIAGNOSIS — Z8751 Personal history of pre-term labor: Secondary | ICD-10-CM

## 2024-12-03 DIAGNOSIS — Z3A19 19 weeks gestation of pregnancy: Secondary | ICD-10-CM

## 2024-12-03 DIAGNOSIS — Z8759 Personal history of other complications of pregnancy, childbirth and the puerperium: Secondary | ICD-10-CM

## 2024-12-03 DIAGNOSIS — O09899 Supervision of other high risk pregnancies, unspecified trimester: Secondary | ICD-10-CM

## 2024-12-03 DIAGNOSIS — R8271 Bacteriuria: Secondary | ICD-10-CM | POA: Diagnosis not present

## 2024-12-03 DIAGNOSIS — O219 Vomiting of pregnancy, unspecified: Secondary | ICD-10-CM | POA: Diagnosis not present

## 2024-12-03 MED ORDER — PROMETHAZINE HCL 25 MG PO TABS: 25.0000 mg | ORAL_TABLET | Freq: Four times a day (QID) | ORAL | 1 refills | Status: DC | PRN

## 2024-12-03 NOTE — Progress Notes (Signed)
° °  PRENATAL VISIT NOTE  Subjective:  Olivia Taylor is a 28 y.o. G3P1101 at [redacted]w[redacted]d being seen today for ongoing prenatal care.  She is currently monitored for the following issues for this high-risk pregnancy and has ASCUS of cervix with negative high risk HPV; History of incompetent cervix, currently pregnant; History of gestational hypertension; Supervision of high risk pregnancy, antepartum; GBS bacteriuria; History of preterm delivery, currently pregnant; History of IUFD; and Obesity affecting pregnancy, antepartum on their problem list.  Patient reports vaginal pressure.  Contractions: Not present. Vag. Bleeding: None.  Movement: Present. Denies leaking of fluid.   The following portions of the patient's history were reviewed and updated as appropriate: allergies, current medications, past family history, past medical history, past social history, past surgical history and problem list.   Objective:    Vitals:   12/03/24 0842  BP: 121/85  Pulse: 95  Weight: 95 kg    Fetal Status:  Fetal Heart Rate (bpm): 154   Movement: Present    General: Alert, oriented and cooperative. Patient is in no acute distress.  Skin: Skin is warm and dry. No rash noted.   Cardiovascular: Normal heart rate noted  Respiratory: Normal respiratory effort, no problems with respiration noted  Abdomen: Soft, gravid, appropriate for gestational age.  Pain/Pressure: Absent     Pelvic: Cervical exam deferred        Extremities: Normal range of motion.  Edema: None  Mental Status: Normal mood and affect. Normal behavior. Normal judgment and thought content.   Assessment and Plan:  Pregnancy: G3P1101 at [redacted]w[redacted]d 1. Supervision of high risk pregnancy, antepartum -Feeling well today. BP, HR, FHR appropriate  2. Nausea and vomiting during pregnancy -Nausea has improved, still taking Diclegis  midday and nightly, Phenergan  PRN  3. [redacted] weeks gestation of pregnancy (Primary) -Anatomy scan 1/23- rescheduled by  patient  4. History of preterm delivery -Reports vaginal pressure when rising in the mornings that resolves on it's own. Denies VB, LOF, cramping, low back pain -Discussed PTL signs/symptoms, MAU precautions  5. History of gestational hypertension -normotensive today -taking ASA 81mg  daily   Preterm labor symptoms and general obstetric precautions including but not limited to vaginal bleeding, contractions, leaking of fluid and fetal movement were reviewed in detail with the patient. Please refer to After Visit Summary for other counseling recommendations.   No follow-ups on file.  Future Appointments  Date Time Provider Department Center  01/09/2025  2:00 PM [facility] PROVIDER 1 [facility]  01/09/2025  2:30 PM [facility] US4 [facility]    Rolin GORMAN Amel, RN

## 2024-12-03 NOTE — Progress Notes (Signed)
 Pt presents for ROB visit. Requesting refill of Phenergan 

## 2024-12-04 ENCOUNTER — Other Ambulatory Visit

## 2024-12-04 ENCOUNTER — Ambulatory Visit

## 2024-12-05 LAB — URINE CULTURE

## 2025-01-09 ENCOUNTER — Other Ambulatory Visit

## 2025-01-09 ENCOUNTER — Encounter: Payer: Self-pay | Admitting: Physician Assistant

## 2025-01-09 ENCOUNTER — Ambulatory Visit

## 2025-01-15 ENCOUNTER — Encounter: Admitting: Obstetrics

## 2025-01-15 ENCOUNTER — Ambulatory Visit

## 2025-01-15 ENCOUNTER — Ambulatory Visit: Attending: Obstetrics and Gynecology | Admitting: Obstetrics and Gynecology

## 2025-01-15 VITALS — BP 134/89 | HR 93

## 2025-01-15 DIAGNOSIS — O99212 Obesity complicating pregnancy, second trimester: Secondary | ICD-10-CM

## 2025-01-15 DIAGNOSIS — Z3A25 25 weeks gestation of pregnancy: Secondary | ICD-10-CM

## 2025-01-15 DIAGNOSIS — O3432 Maternal care for cervical incompetence, second trimester: Secondary | ICD-10-CM | POA: Insufficient documentation

## 2025-01-15 DIAGNOSIS — O0990 Supervision of high risk pregnancy, unspecified, unspecified trimester: Secondary | ICD-10-CM | POA: Diagnosis not present

## 2025-01-15 DIAGNOSIS — O09899 Supervision of other high risk pregnancies, unspecified trimester: Secondary | ICD-10-CM | POA: Diagnosis not present

## 2025-01-15 DIAGNOSIS — O09292 Supervision of pregnancy with other poor reproductive or obstetric history, second trimester: Secondary | ICD-10-CM

## 2025-01-15 DIAGNOSIS — O0992 Supervision of high risk pregnancy, unspecified, second trimester: Secondary | ICD-10-CM | POA: Diagnosis present

## 2025-01-15 DIAGNOSIS — E669 Obesity, unspecified: Secondary | ICD-10-CM | POA: Diagnosis not present

## 2025-01-15 DIAGNOSIS — O99210 Obesity complicating pregnancy, unspecified trimester: Secondary | ICD-10-CM

## 2025-01-15 DIAGNOSIS — O09299 Supervision of pregnancy with other poor reproductive or obstetric history, unspecified trimester: Secondary | ICD-10-CM | POA: Diagnosis not present

## 2025-01-15 DIAGNOSIS — Z8759 Personal history of other complications of pregnancy, childbirth and the puerperium: Secondary | ICD-10-CM

## 2025-01-15 DIAGNOSIS — Z363 Encounter for antenatal screening for malformations: Secondary | ICD-10-CM | POA: Insufficient documentation

## 2025-01-15 NOTE — Progress Notes (Signed)
 After review, MFM consult with provider is not indicated for today  Arna Ranks, MD 01/15/2025 9:55 AM  [facility]

## 2025-01-19 ENCOUNTER — Telehealth: Admitting: Obstetrics and Gynecology

## 2025-01-19 ENCOUNTER — Encounter: Payer: Self-pay | Admitting: Obstetrics and Gynecology

## 2025-01-19 ENCOUNTER — Encounter: Admitting: Obstetrics and Gynecology

## 2025-01-19 DIAGNOSIS — Z8759 Personal history of other complications of pregnancy, childbirth and the puerperium: Secondary | ICD-10-CM

## 2025-01-19 DIAGNOSIS — Z3A26 26 weeks gestation of pregnancy: Secondary | ICD-10-CM

## 2025-01-19 DIAGNOSIS — O0990 Supervision of high risk pregnancy, unspecified, unspecified trimester: Secondary | ICD-10-CM

## 2025-01-19 DIAGNOSIS — R8271 Bacteriuria: Secondary | ICD-10-CM

## 2025-01-19 DIAGNOSIS — O09899 Supervision of other high risk pregnancies, unspecified trimester: Secondary | ICD-10-CM

## 2025-01-19 DIAGNOSIS — O99210 Obesity complicating pregnancy, unspecified trimester: Secondary | ICD-10-CM

## 2025-01-19 MED ORDER — TERCONAZOLE 0.8 % VA CREA: 1.0000 | TOPICAL_CREAM | Freq: Every day | VAGINAL | 0 refills | Status: AC

## 2025-01-19 NOTE — Progress Notes (Signed)
 S/w pt for virtual visit Reports fetal movement, denies pain Pt does not have BP cuff with her, but states that she can update babyscripts later today. Pt reports that she was previously treated for yeast infection but continues to have a yellowish discharge with irritation.

## 2025-01-20 ENCOUNTER — Other Ambulatory Visit: Payer: Self-pay | Admitting: Obstetrics and Gynecology

## 2025-01-20 DIAGNOSIS — O219 Vomiting of pregnancy, unspecified: Secondary | ICD-10-CM

## 2025-01-20 DIAGNOSIS — O0990 Supervision of high risk pregnancy, unspecified, unspecified trimester: Secondary | ICD-10-CM

## 2025-01-23 ENCOUNTER — Other Ambulatory Visit: Payer: Self-pay

## 2025-02-02 ENCOUNTER — Encounter: Payer: Self-pay | Admitting: Obstetrics and Gynecology

## 2025-02-12 ENCOUNTER — Encounter: Payer: Self-pay | Admitting: Family Medicine

## 2025-02-12 ENCOUNTER — Other Ambulatory Visit

## 2025-02-23 ENCOUNTER — Ambulatory Visit
